# Patient Record
Sex: Male | Born: 1935 | Race: Black or African American | Hispanic: No | Marital: Married
Health system: Southern US, Community
[De-identification: ages and names within clinical notes are randomized; demographics above are authoritative.]

## PROBLEM LIST (undated history)

## (undated) DIAGNOSIS — I10 Essential (primary) hypertension: Secondary | ICD-10-CM

## (undated) DIAGNOSIS — E785 Hyperlipidemia, unspecified: Secondary | ICD-10-CM

---

## 1999-07-11 ENCOUNTER — Emergency Department (HOSPITAL_COMMUNITY): Admission: EM | Admit: 1999-07-11 | Discharge: 1999-07-11 | Payer: Self-pay | Admitting: Emergency Medicine

## 2002-01-20 ENCOUNTER — Encounter: Admission: RE | Admit: 2002-01-20 | Discharge: 2002-01-20 | Payer: Self-pay | Admitting: *Deleted

## 2002-02-03 ENCOUNTER — Encounter: Admission: RE | Admit: 2002-02-03 | Discharge: 2002-02-19 | Payer: Self-pay | Admitting: *Deleted

## 2002-03-19 ENCOUNTER — Encounter: Admission: RE | Admit: 2002-03-19 | Discharge: 2002-03-19 | Payer: Self-pay | Admitting: Neurosurgery

## 2002-03-19 ENCOUNTER — Encounter: Payer: Self-pay | Admitting: Neurosurgery

## 2002-04-05 ENCOUNTER — Encounter: Admission: RE | Admit: 2002-04-05 | Discharge: 2002-04-05 | Payer: Self-pay | Admitting: Neurosurgery

## 2002-04-05 ENCOUNTER — Encounter: Payer: Self-pay | Admitting: Neurosurgery

## 2002-04-19 ENCOUNTER — Encounter: Admission: RE | Admit: 2002-04-19 | Discharge: 2002-04-19 | Payer: Self-pay | Admitting: Neurosurgery

## 2002-04-19 ENCOUNTER — Encounter: Payer: Self-pay | Admitting: Neurosurgery

## 2003-01-26 ENCOUNTER — Encounter: Payer: Self-pay | Admitting: Internal Medicine

## 2003-01-26 ENCOUNTER — Encounter: Admission: RE | Admit: 2003-01-26 | Discharge: 2003-01-26 | Payer: Self-pay | Admitting: Internal Medicine

## 2003-04-25 ENCOUNTER — Encounter: Payer: Self-pay | Admitting: Internal Medicine

## 2003-04-25 ENCOUNTER — Encounter: Admission: RE | Admit: 2003-04-25 | Discharge: 2003-04-25 | Payer: Self-pay | Admitting: Internal Medicine

## 2003-05-27 ENCOUNTER — Encounter (INDEPENDENT_AMBULATORY_CARE_PROVIDER_SITE_OTHER): Payer: Self-pay | Admitting: Specialist

## 2003-05-27 ENCOUNTER — Ambulatory Visit (HOSPITAL_COMMUNITY): Admission: RE | Admit: 2003-05-27 | Discharge: 2003-05-27 | Payer: Self-pay | Admitting: *Deleted

## 2003-07-29 ENCOUNTER — Ambulatory Visit (HOSPITAL_COMMUNITY): Admission: RE | Admit: 2003-07-29 | Discharge: 2003-07-29 | Payer: Self-pay | Admitting: General Surgery

## 2004-03-07 ENCOUNTER — Ambulatory Visit (HOSPITAL_COMMUNITY): Admission: RE | Admit: 2004-03-07 | Discharge: 2004-03-07 | Payer: Self-pay | Admitting: Internal Medicine

## 2004-12-07 ENCOUNTER — Encounter: Admission: RE | Admit: 2004-12-07 | Discharge: 2004-12-07 | Payer: Self-pay | Admitting: Urology

## 2004-12-14 ENCOUNTER — Ambulatory Visit (HOSPITAL_COMMUNITY): Admission: RE | Admit: 2004-12-14 | Discharge: 2004-12-14 | Payer: Self-pay | Admitting: Urology

## 2004-12-14 ENCOUNTER — Encounter (INDEPENDENT_AMBULATORY_CARE_PROVIDER_SITE_OTHER): Payer: Self-pay | Admitting: Specialist

## 2004-12-14 ENCOUNTER — Ambulatory Visit (HOSPITAL_BASED_OUTPATIENT_CLINIC_OR_DEPARTMENT_OTHER): Admission: RE | Admit: 2004-12-14 | Discharge: 2004-12-14 | Payer: Self-pay | Admitting: Urology

## 2005-03-04 ENCOUNTER — Ambulatory Visit (HOSPITAL_BASED_OUTPATIENT_CLINIC_OR_DEPARTMENT_OTHER): Admission: RE | Admit: 2005-03-04 | Discharge: 2005-03-04 | Payer: Self-pay | Admitting: Urology

## 2005-03-04 ENCOUNTER — Ambulatory Visit (HOSPITAL_COMMUNITY): Admission: RE | Admit: 2005-03-04 | Discharge: 2005-03-04 | Payer: Self-pay | Admitting: Urology

## 2008-08-02 ENCOUNTER — Encounter: Admission: RE | Admit: 2008-08-02 | Discharge: 2008-08-02 | Payer: Self-pay | Admitting: Internal Medicine

## 2008-08-23 ENCOUNTER — Encounter: Admission: RE | Admit: 2008-08-23 | Discharge: 2008-08-23 | Payer: Self-pay | Admitting: Internal Medicine

## 2008-09-06 ENCOUNTER — Encounter: Admission: RE | Admit: 2008-09-06 | Discharge: 2008-09-06 | Payer: Self-pay | Admitting: Internal Medicine

## 2011-04-26 NOTE — Op Note (Signed)
NAME:  Travis Friedman, Travis Friedman NO.:  [id number]   MEDICAL RECORD NO.:  [id number]          PATIENT TYPE:  AMB   LOCATION:  [facility]                         FACILITY:  [facility]   PHYSICIAN:  Mark C. Vernie Ammons, M.D.  DATE OF BIRTH:  18-Jan-1926   DATE OF PROCEDURE:  12/14/2004  DATE OF DISCHARGE:                                 OPERATIVE REPORT   PREOPERATIVE DIAGNOSES:  1.  Peyronie's disease with penile curvature.  2.  Left epididymal cyst.   POSTOPERATIVE DIAGNOSES:  1.  Peyronie's disease with penile curvature.  2.  Left epididymal cyst.   PROCEDURES:  1.  Nesbit tuck.  2.  Excision of left epididymal cyst.   SURGEON:  Mark C. Vernie Ammons, M.D.   ANESTHESIA:  General anesthesia.   ESTIMATED BLOOD LOSS:  10 mL.   SPECIMENS:  Cyst wall to pathology.   DRAIN:  None.   COMPLICATIONS:  None.   INDICATIONS FOR PROCEDURE:  Patient is a 77 year old black male who has  erectile dysfunction managed with oral agents.  He has penile curvature and  has elected to undergo a Nesbit tuck after considering the treatment  options, risks and complications.  He mentioned also that he would like to  have the cyst in the left hemiscrotum treated at the same time.  It has  caused him pain.   DESCRIPTION OF PROCEDURE:  After informed consent, patient brought to the  major OR and placed on the table and administered general anesthesia, then  had his genitalia sterilely prepped and draped.  A midline median Raphe  incision was then made and the left testicle was exposed and delivered  through the incision.  Large cyst was identified and excised from the top of  the epididymis.  The edges of the incision were fulgurated.  No bleeding was  noted.  There were a couple of small cysts along the lateral aspect of the  epididymis with its junction in the testicle. These were approximately 2 mm  in size and there were three of these.  These were each fulgurated.  I then  replaced the testicle back  in the parietal tunica vaginalis and closed that  with a running locking 4-0 chromic suture.  The testicle was then replaced  in the left hemiscrotum and the skin was closed after observation for any  bleeding points, with running 3-0 chromic suture.   Attention was then directed to the penis where dorsal penile block was  performed using 17 mL of 0.25% plain Marcaine.  I then retracted the  foreskin and made a circumcising incision circumferentially around  approximately a centimeter from the coronal sulcus.  The penile shaft skin  was then dissected back to the base and a half inch Penrose drain was placed  around the base of the penis as a penile tourniquet.  A 21-gauge butterfly  needle was then placed in the right corpus cavernosum and an artificial  erection was performed using sterile injectable saline.  This resulting in a  good erection but significant dorsal curvature, approximately 50 to 60%.  I  noted the area  of greatest curvature and opposite this on the ventral  surface next to the corpus spongiosum.  Tissue was cleared from the  overlying corpora.  Allis clamps were placed bilaterally and repeat  artificial erection was performed.  There was straightening of the penis  noted with this maneuver.  I therefore used a 4-0 Ethibond suture, placed it  beneath the Allis clamps on each side in a figure-of-eight fashion and tied  these each.  Repeat artificial erection revealed still some curvature so I  repeated this procedure more proximally.  After completing this and  repeating the artificial erection, I noted significant improvement in his  curvature.  There was still some slight curvature persisting, however, my  feeling was that further Nesbit tucks would potentially foreshorten the  penis further and therefore elected not to perform that, leaving a slight  amount of curvature but yet a fully functional and nearly straight penis.  Small bleeding noted after removal of the  tourniquet were fulgurated.  I  then replaced the skin in its normal anatomic position and reapproximated  the skin edges with a running 3-0 chromic suture.  Neosporin was placed on  the suture line and the foreskin was placed back over the glans in its  normal anatomic position.  Fluff 4x4s were applied to the scrotal incision  as well as a sterile gauze dressing and scrotal support.  The patient  tolerated the procedure well.  There were no intraoperative complications.  Sponge, needle and instrument counts were correct at the end of the  operation.   He will begin a prescription of 40 Tylox and 10 Keflex to take at 500 mg  dose b.i.d.  He will follow up in my office in two weeks and contact me if  he has any difficulty in the interim.     Mark   MCO/MEDQ  D:  12/14/2004  T:  12/14/2004  Job:  [id number]

## 2011-04-26 NOTE — Op Note (Signed)
   NAME:  Travis Friedman, Travis Friedman NO.:  [id number]   MEDICAL RECORD NO.:  [id number]                   PATIENT TYPE:  AMB   LOCATION:  ENDO                                 FACILITY:  [facility]   PHYSICIAN:  Georgiana Spinner, M.D.                 DATE OF BIRTH:  09-14-1926   DATE OF PROCEDURE:  DATE OF DISCHARGE:                                 OPERATIVE REPORT   PROCEDURE:  Upper endoscopy.   INDICATIONS FOR PROCEDURE:  Dysphagia, weight loss.   ANESTHESIA:  Demerol 45, Versed 5 mg.   DESCRIPTION OF PROCEDURE:  With the patient mildly sedated in the left  lateral decubitus position, the Olympus videoscopic endoscope was inserted  in the mouth and passed under direct vision through the esophagus which  appeared normal into the stomach. The fundus, body, and antrum were normal  except for some changes of erythema in the prepyloric area of the antrum.  The duodenal bulb and second portion of the duodenum appeared normal. From  this point, the endoscope was slowly withdrawn taking circumferential views  of the duodenal mucosa until the endoscope was then pulled back in the  stomach, placed in retroflexion to view the stomach from below. The  endoscope was then straightened and withdrawn taking circumferential views  of the remaining gastric and esophageal mucosa. The patient's vital signs  and pulse oximeter remained stable. The patient tolerated the procedure well  without apparent complications.   FINDINGS:  Mild erythema of the prepyloric area biopsied. Await biopsy  report. The patient will call me for results and followup with me as an  outpatient. Proceed to colonoscopy.                                               Georgiana Spinner, M.D.    GMO/MEDQ  D:  05/27/2003  T:  05/28/2003  Job:  [id number]

## 2011-04-26 NOTE — Op Note (Signed)
NAME:  KRATOS, RUSCITTI                         ACCOUNT NO.:  [id number]   MEDICAL RECORD NO.:  [id number]                   PATIENT TYPE:  AMB   LOCATION:  DAY                                  FACILITY:  [facility]   PHYSICIAN:  Adolph Pollack, M.D.            DATE OF BIRTH:  [birth year]   DATE OF PROCEDURE:  07/29/2003  DATE OF DISCHARGE:                                 OPERATIVE REPORT   PREOPERATIVE DIAGNOSIS:  Left inguinal hernia.   POSTOPERATIVE DIAGNOSIS:  Indirect left inguinal hernia.   PROCEDURE:  Repair of indirect left inguinal hernia with mesh.   SURGEON:  Adolph Pollack, M.D.   ANESTHESIA:  General by way of LMA.   INDICATION:  Mr. Brandis is a 77 year old male, who has an enlarging left  inguinal bulge that is symptomatic.  On examination, he has a left inguinal  hernia.  The right inguinal floor was solid.  He now presents for elective  repair.  The procedure and the risks were discussed with him preoperatively.   TECHNIQUE:  He is seen in the holding area, and the left groin marked with  my initials.  He was then brought to the operating room, placed supine on  the operating table, and a general anesthetic by way of LMA was  administered.  The left groin area was shaved, marked again, then sterilely  prepped and draped.  Local anesthetic consisting of 0.5% plain Marcaine was  infiltrated in the subdermal and deep subcutaneous tissues.  An oblique  incision was made in the left groin and carried down through the Scarpa's  fascia to the level of the external oblique aponeurosis.  More local  anesthetic was infiltrated deep to this, and a small incision was made in  the external oblique aponeurosis.  It was then split in the direction of its  fibers through the external ring medially and up toward the anterior-  superior iliac spine laterally.  Using blunt dissection, the underlying  internal oblique muscle and aponeurosis were exposed superiorly, and the  shelving edge of the inguinal ligament was exposed inferiorly.  Pubic  tubercle was palpated, and the cord was isolated here.  There was an  indirect sac with what appeared to be preperitoneal fat in it.  There was  also a lipoma when the cord was excised.  I reduced the indirect sac back  through a patulous internal ring where it would stay.  I placed a Penrose  drain around the spermatic cord.   Next, a piece of 3 x 6 inch polypropylene mesh was brought into the field  and anchored 1 cm medial to the pubic tubercle with 2-0 Prolene suture.  The  inferior aspect of the mesh was then anchored to the shelving edge of the  inguinal ligament with a running 0 Prolene suture up to the level 1 cm  lateral to the internal ring.  A slit was  cut in the mesh and wrapped around  the cord.  The superior aspect of the mesh was then anchored to the internal  oblique muscle and aponeurosis with interrupted 2-0 Vicryl sutures.  The two  tails of the mesh were crossed, creating a new internal ring, and these were  then anchored to the shelving edge of the inguinal ligament with a single 2-  0 Prolene suture.  The tip of the hemostat could be placed in the new  aperture, and the cord was mobile.   At this point, hemostasis was found to be adequate.  I then closed the  external oblique aponeurosis over the mesh with a running 3-0 Vicryl suture.  Scarpa's fascia was closed with a running 3-0 Vicryl suture.  The skin was  closed with a 4-0 Monocryl subcuticular stitch.  Steri-Strips and sterile  dressings were applied.   He tolerated the procedure well without any apparent complications.  The  left testicle was in its normal position in the scrotum.  He was taken to  the recovery room in satisfactory condition.  I plan to discharge him to  home with discharge instructions and Tylox for pain and see him back in the  office in 2-3 weeks.                                               Adolph Pollack,  M.D.    Kari Baars  D:  07/29/2003  T:  07/29/2003  Job:  [id number]   cc:   Loraine Leriche C. Vernie Ammons, M.D.  [address]  [place]  [place] [address]  Fax: [phone]   Georgianne Fick, M.D.  [address]  [place]  [place] [address]  Fax: [phone]

## 2011-04-26 NOTE — Op Note (Signed)
   NAME:  Travis Friedman, Travis Friedman NO.:  [id number]   MEDICAL RECORD NO.:  [id number]                   PATIENT TYPE:  AMB   LOCATION:  ENDO                                 FACILITY[facility]  [facility]   PHYSICIAN:  Georgiana Spinner, M.D.                 DATE OF BIRTH:  06-03-1926   DATE OF PROCEDURE:  05/27/2003  DATE OF DISCHARGE:                                 OPERATIVE REPORT   PROCEDURE:  Colonoscopy.   INDICATIONS:  Weight loss, colon cancer screening.   ANESTHESIA:  1. Demerol 20 mg.  2. Versed 1.5 mg.   DESCRIPTION OF PROCEDURE:  With patient mildly sedated in the left lateral  decubitus position, the Olympus videoscopic colonoscope was inserted in the  rectum and passed under direct vision to the cecum, identified by the  ileocecal valve and appendiceal orifice.  From this point, the colonoscope  was slowly withdrawn, taking circumferential views of the entire colonic  mucosa, stopping only in the rectum which appeared normal on direct and  showed hemorrhoids on retroflexed view.  The endoscope was straightened and  withdrawn.  The patient's vital signs and pulse oximeter remained stable.  The patient tolerated the procedure well without apparent complications.   FINDINGS:  Internal hemorrhoids, otherwise unremarkable colonoscopic  examination.   PLAN:  See endoscopy note for further details.                                               Georgiana Spinner, M.D.    GMO/MEDQ  D:  05/27/2003  T:  05/28/2003  Job:  [id number]

## 2011-04-26 NOTE — Op Note (Signed)
NAME:  Travis Friedman, Travis Friedman NO.:  [id number]   MEDICAL RECORD NO.:  [id number]          PATIENT TYPE:  AMB   LOCATION:  [facility]                         FACILITY:  [facility]   PHYSICIAN:  Mark C. Vernie Ammons, M.D.  DATE OF BIRTH:  [birth year]   DATE OF PROCEDURE:  03/04/2005  DATE OF DISCHARGE:                                 OPERATIVE REPORT   PREOPERATIVE DIAGNOSIS:  Phimosis   POSTOPERATIVE DIAGNOSIS:  Phimosis.   PROCEDURE:  Circumcision.   SURGEON:  Mark C. Vernie Ammons, M.D.   ANESTHESIA:  General.   BLOOD LOSS:  15 mL.   SPECIMENS:  None.   DRAINS:  None.   COMPLICATIONS:  None.   INDICATIONS:  The patient is a 77 year old black male who had a Nesbit tuck  procedure for Peyronie's disease earlier this year.  He did well  postoperatively, but he was uncircumcised and developed an inability to  retract his foreskin.  I think what happened was the two halves of the inner  and outer preputial tissue used together.  We discussed the fact that  circumcision was the only form of treatment for that, and he understands the  risks and complications as well as increased risk of edema formation.  He  has elected to proceed with circumcision at this time.   DESCRIPTION OF OPERATION:  After informed consent, the patient was brought  to the major OR and placed on the table, administered general anesthesia,  after which his genitalia was sterilely prepped and draped.  A 16-French  Foley catheter was placed in the bladder and I initially attempted to  retract foreskin, but it was densely scarred as described above.  I first  tried to incise circumferentially at the junction of the inner and outer  preputial skin and dissect the shaft skin off the inner preputial skin but  due to the extensive scarring was unsuccessful.  The only way to remove the  skin and allow me to perform a circumcision was to make a midline incision  in the skin back to close to the corona and back down the  shaft of the  penis.  This allowed me to then dissect the skin off of the penis  circumferentially.  I then excised the redundant inner preputial skin,  making an incision approximately cm proximal to the corona  circumferentially.  The inner preputial skin was then excised.  There was a  great deal of scarring. This was very difficult and extended the time need  to greater than double the usual time it takes to do this procedure.  Bleeding points were cauterized with electrocautery.  I then reapproximated  the midline penile skin incision with running 3-0 chromic suture until I got  to a point where there was enough shaft skin left for appropriate closure.  I excised the remaining shaft skin circumferentially and placed 3-0 chromic  sutures in the frenulum in an fashion.  A U stitch was then placed at the  level of frenulum and I then reapproximated the two skin edges with running  3-0 chromic suture.  A dorsal penile  block was then performed using 0.5%  plain Marcaine in the standard fashion and Neosporin as well as a loose  dressing was applied.  The patient was awakened and taken to the recovery  room in stable, satisfactory condition.  He tolerated procedure well with no  intraoperative complications.   He was given a prescription for 36 Tylox and Keflex 500 mg t.i.d. for five  days.  He will follow up my office in four weeks for recheck.      MCO/MEDQ  D:  03/04/2005  T:  03/04/2005  Job:  [id number]

## 2015-03-23 ENCOUNTER — Other Ambulatory Visit: Payer: Self-pay | Admitting: Internal Medicine

## 2015-03-23 ENCOUNTER — Ambulatory Visit
Admission: RE | Admit: 2015-03-23 | Discharge: 2015-03-23 | Disposition: A | Discharge status: Home / Self Care | Payer: Self-pay | Source: Ambulatory Visit | Attending: Internal Medicine | Admitting: Internal Medicine

## 2015-03-23 DIAGNOSIS — R1084 Generalized abdominal pain: Secondary | ICD-10-CM

## 2015-03-23 DIAGNOSIS — R52 Pain, unspecified: Secondary | ICD-10-CM

## 2015-03-23 DIAGNOSIS — R634 Abnormal weight loss: Secondary | ICD-10-CM

## 2015-03-23 MED ORDER — IOPAMIDOL (ISOVUE-300) INJECTION 61%
100.0000 mL | Freq: Once | INTRAVENOUS | Status: AC | PRN
  Administered 2015-03-23: 100 mL via INTRAVENOUS

## 2015-04-15 ENCOUNTER — Inpatient Hospital Stay (HOSPITAL_COMMUNITY): Payer: Medicare Other

## 2015-04-15 ENCOUNTER — Encounter (HOSPITAL_COMMUNITY): Payer: Self-pay | Admitting: Emergency Medicine

## 2015-04-15 ENCOUNTER — Inpatient Hospital Stay (HOSPITAL_COMMUNITY)
Admission: EM | Admit: 2015-04-15 | Discharge: 2015-05-10 | DRG: 377 | Disposition: E | Payer: Medicare Other | Attending: Pulmonary Disease | Admitting: Pulmonary Disease

## 2015-04-15 DIAGNOSIS — G9340 Encephalopathy, unspecified: Secondary | ICD-10-CM

## 2015-04-15 DIAGNOSIS — E1165 Type 2 diabetes mellitus with hyperglycemia: Secondary | ICD-10-CM | POA: Diagnosis present

## 2015-04-15 DIAGNOSIS — E876 Hypokalemia: Secondary | ICD-10-CM | POA: Diagnosis present

## 2015-04-15 DIAGNOSIS — Z66 Do not resuscitate: Secondary | ICD-10-CM | POA: Diagnosis not present

## 2015-04-15 DIAGNOSIS — T39395A Adverse effect of other nonsteroidal anti-inflammatory drugs [NSAID], initial encounter: Secondary | ICD-10-CM | POA: Diagnosis not present

## 2015-04-15 DIAGNOSIS — Z791 Long term (current) use of non-steroidal anti-inflammatories (NSAID): Secondary | ICD-10-CM | POA: Diagnosis not present

## 2015-04-15 DIAGNOSIS — D72829 Elevated white blood cell count, unspecified: Secondary | ICD-10-CM | POA: Diagnosis present

## 2015-04-15 DIAGNOSIS — R579 Shock, unspecified: Secondary | ICD-10-CM | POA: Diagnosis present

## 2015-04-15 DIAGNOSIS — D696 Thrombocytopenia, unspecified: Secondary | ICD-10-CM | POA: Diagnosis present

## 2015-04-15 DIAGNOSIS — K264 Chronic or unspecified duodenal ulcer with hemorrhage: Principal | ICD-10-CM | POA: Diagnosis present

## 2015-04-15 DIAGNOSIS — G9341 Metabolic encephalopathy: Secondary | ICD-10-CM | POA: Diagnosis present

## 2015-04-15 DIAGNOSIS — R58 Hemorrhage, not elsewhere classified: Secondary | ICD-10-CM

## 2015-04-15 DIAGNOSIS — E872 Acidosis, unspecified: Secondary | ICD-10-CM

## 2015-04-15 DIAGNOSIS — T68XXXA Hypothermia, initial encounter: Secondary | ICD-10-CM

## 2015-04-15 DIAGNOSIS — D62 Acute posthemorrhagic anemia: Secondary | ICD-10-CM | POA: Diagnosis present

## 2015-04-15 DIAGNOSIS — I10 Essential (primary) hypertension: Secondary | ICD-10-CM | POA: Diagnosis present

## 2015-04-15 DIAGNOSIS — N179 Acute kidney failure, unspecified: Secondary | ICD-10-CM | POA: Diagnosis present

## 2015-04-15 DIAGNOSIS — Z79899 Other long term (current) drug therapy: Secondary | ICD-10-CM | POA: Diagnosis not present

## 2015-04-15 DIAGNOSIS — I9589 Other hypotension: Secondary | ICD-10-CM

## 2015-04-15 DIAGNOSIS — E785 Hyperlipidemia, unspecified: Secondary | ICD-10-CM | POA: Diagnosis present

## 2015-04-15 DIAGNOSIS — F0391 Unspecified dementia with behavioral disturbance: Secondary | ICD-10-CM | POA: Diagnosis present

## 2015-04-15 DIAGNOSIS — K922 Gastrointestinal hemorrhage, unspecified: Secondary | ICD-10-CM | POA: Diagnosis not present

## 2015-04-15 HISTORY — DX: Essential (primary) hypertension: I10

## 2015-04-15 HISTORY — DX: Hyperlipidemia, unspecified: E78.5

## 2015-04-15 LAB — I-STAT CG4 LACTIC ACID, ED: Lactic Acid, Venous: 8.59 mmol/L (ref 0.5–2.0)

## 2015-04-15 LAB — COMPREHENSIVE METABOLIC PANEL
ALT: 10 U/L — AB (ref 17–63)
ANION GAP: 10 (ref 5–15)
AST: 20 U/L (ref 15–41)
Albumin: 2 g/dL — ABNORMAL LOW (ref 3.5–5.0)
Alkaline Phosphatase: 47 U/L (ref 38–126)
BUN: 64 mg/dL — AB (ref 6–20)
CALCIUM: 7.4 mg/dL — AB (ref 8.9–10.3)
CO2: 16 mmol/L — AB (ref 22–32)
Chloride: 109 mmol/L (ref 101–111)
Creatinine, Ser: 2.2 mg/dL — ABNORMAL HIGH (ref 0.61–1.24)
GFR calc Af Amer: 29 mL/min — ABNORMAL LOW (ref >60)
GFR, EST NON AFRICAN AMERICAN: 25 mL/min — AB (ref >60)
Glucose, Bld: 232 mg/dL — ABNORMAL HIGH (ref 70–99)
Potassium: 2.4 mmol/L — CL (ref 3.5–5.1)
SODIUM: 135 mmol/L (ref 135–145)
Total Bilirubin: 0.6 mg/dL (ref 0.3–1.2)
Total Protein: 4.3 g/dL — ABNORMAL LOW (ref 6.5–8.1)

## 2015-04-15 LAB — I-STAT CHEM 8, ED
BUN: 69 mg/dL — ABNORMAL HIGH (ref 6–20)
BUN: 57 mg/dL — ABNORMAL HIGH (ref 6–20)
CALCIUM ION: 1.1 mmol/L — AB (ref 1.13–1.30)
CREATININE: 1.8 mg/dL — AB (ref 0.61–1.24)
Calcium, Ion: 1 mmol/L — ABNORMAL LOW (ref 1.13–1.30)
Chloride: 105 mmol/L (ref 101–111)
Chloride: 106 mmol/L (ref 101–111)
Creatinine, Ser: 2.1 mg/dL — ABNORMAL HIGH (ref 0.61–1.24)
GLUCOSE: 225 mg/dL — AB (ref 70–99)
GLUCOSE: 166 mg/dL — AB (ref 70–99)
HCT: 36 % — ABNORMAL LOW (ref 39.0–52.0)
HEMATOCRIT: 13 % — AB (ref 39.0–52.0)
HEMOGLOBIN: 4.4 g/dL — AB (ref 13.0–17.0)
HEMOGLOBIN: 12.2 g/dL — AB (ref 13.0–17.0)
POTASSIUM: 3.1 mmol/L — AB (ref 3.5–5.1)
Potassium: 2.7 mmol/L — CL (ref 3.5–5.1)
Sodium: 142 mmol/L (ref 135–145)
Sodium: 144 mmol/L (ref 135–145)
TCO2: 14 mmol/L (ref 0–100)
TCO2: 15 mmol/L (ref 0–100)

## 2015-04-15 LAB — CBC WITH DIFFERENTIAL/PLATELET
BASOS ABS: 0 10*3/uL (ref 0.0–0.1)
Basophils Relative: 0 % (ref 0–1)
EOS PCT: 0 % (ref 0–5)
Eosinophils Absolute: 0 10*3/uL (ref 0.0–0.7)
HCT: 17 % — ABNORMAL LOW (ref 39.0–52.0)
Hemoglobin: 5.7 g/dL — CL (ref 13.0–17.0)
LYMPHS PCT: 8 % — AB (ref 12–46)
Lymphs Abs: 1 10*3/uL (ref 0.7–4.0)
MCH: 30.2 pg (ref 26.0–34.0)
MCHC: 33.5 g/dL (ref 30.0–36.0)
MCV: 89.9 fL (ref 78.0–100.0)
Monocytes Absolute: 0.9 10*3/uL (ref 0.1–1.0)
Monocytes Relative: 7 % (ref 3–12)
NEUTROS ABS: 10.5 10*3/uL — AB (ref 1.7–7.7)
NEUTROS PCT: 85 % — AB (ref 43–77)
PLATELETS: 147 10*3/uL — AB (ref 150–400)
RBC: 1.89 MIL/uL — ABNORMAL LOW (ref 4.22–5.81)
RDW: 13.1 % (ref 11.5–15.5)
WBC: 12.4 10*3/uL — ABNORMAL HIGH (ref 4.0–10.5)

## 2015-04-15 LAB — PROTIME-INR
INR: 1.34 (ref 0.00–1.49)
Prothrombin Time: 16.7 seconds — ABNORMAL HIGH (ref 11.6–15.2)

## 2015-04-15 LAB — I-STAT TROPONIN, ED: Troponin i, poc: 0.01 ng/mL (ref 0.00–0.08)

## 2015-04-15 LAB — PREPARE RBC (CROSSMATCH)

## 2015-04-15 LAB — CBC
HCT: 35 % — ABNORMAL LOW (ref 39.0–52.0)
Hemoglobin: 12 g/dL — ABNORMAL LOW (ref 13.0–17.0)
MCH: 30.6 pg (ref 26.0–34.0)
MCHC: 34.3 g/dL (ref 30.0–36.0)
MCV: 89.3 fL (ref 78.0–100.0)
Platelets: 111 10*3/uL — ABNORMAL LOW (ref 150–400)
RBC: 3.92 MIL/uL — ABNORMAL LOW (ref 4.22–5.81)
RDW: 13 % (ref 11.5–15.5)
WBC: 20 10*3/uL — AB (ref 4.0–10.5)

## 2015-04-15 LAB — ABO/RH: ABO/RH(D): O POS

## 2015-04-15 MED ORDER — HALOPERIDOL LACTATE 5 MG/ML IJ SOLN
5.0000 mg | Freq: Once | INTRAMUSCULAR | Status: AC
  Administered 2015-04-15: 5 mg via INTRAVENOUS
  Filled 2015-04-15: qty 1

## 2015-04-15 MED ORDER — PANTOPRAZOLE SODIUM 40 MG IV SOLR
8.0000 mg/h | INTRAVENOUS | Status: DC
  Administered 2015-04-15 – 2015-04-16 (×2): 8 mg/h via INTRAVENOUS
  Filled 2015-04-15 (×4): qty 80

## 2015-04-15 MED ORDER — SODIUM CHLORIDE 0.9 % IV BOLUS (SEPSIS)
1000.0000 mL | Freq: Once | INTRAVENOUS | Status: AC
Start: 2015-04-15 — End: 2015-04-15
  Administered 2015-04-15: 1000 mL via INTRAVENOUS

## 2015-04-15 MED ORDER — HALOPERIDOL LACTATE 5 MG/ML IJ SOLN
5.0000 mg | Freq: Four times a day (QID) | INTRAMUSCULAR | Status: DC | PRN
Start: 2015-04-15 — End: 2015-04-16
  Administered 2015-04-16: 5 mg via INTRAVENOUS
  Filled 2015-04-15: qty 1

## 2015-04-15 MED ORDER — ONDANSETRON HCL 4 MG/2ML IJ SOLN
4.0000 mg | Freq: Once | INTRAMUSCULAR | Status: AC
  Administered 2015-04-15: 4 mg via INTRAVENOUS
  Filled 2015-04-15: qty 2

## 2015-04-15 MED ORDER — POTASSIUM CHLORIDE 10 MEQ/100ML IV SOLN
10.0000 meq | Freq: Once | INTRAVENOUS | Status: AC
  Administered 2015-04-15: 10 meq via INTRAVENOUS
  Filled 2015-04-15: qty 100

## 2015-04-15 MED ORDER — CEFTRIAXONE SODIUM 1 G IJ SOLR
1.0000 g | Freq: Two times a day (BID) | INTRAMUSCULAR | Status: DC
  Administered 2015-04-15 – 2015-04-16 (×2): 1 g via INTRAVENOUS
  Filled 2015-04-15 (×3): qty 10

## 2015-04-15 MED ORDER — PANTOPRAZOLE SODIUM 40 MG IV SOLR
80.0000 mg | Freq: Once | INTRAVENOUS | Status: AC
  Administered 2015-04-15: 80 mg via INTRAVENOUS
  Filled 2015-04-15: qty 80

## 2015-04-15 MED ORDER — SODIUM CHLORIDE 0.9 % IV SOLN: 250.0000 mL | INTRAVENOUS | Status: DC | PRN

## 2015-04-15 NOTE — ED Notes (Signed)
Per EMS report- called by family for GI bleeding from rectum and nose; EMS reports large amount of dark stools; EMS reports palpable carotid pulses but not radial pulses and agonal respirations that were corrected with BVM by other first responders; IVs established and fluids initiated and patients LOC improved and VS improved; EMS estimates at least 1L blood loss on scene; family on scene reports decrease in ADLs and PO intake x "couple days"

## 2015-04-15 NOTE — ED Notes (Signed)
Family now at bedside- states pt has "had a steady decline over the last few months, not eating, not getting around"; family also states pt had a fall last night in the tub but denies LOC

## 2015-04-15 NOTE — ED Notes (Signed)
Dr Jodi MourningZavitz given a copy of chem 8 and lactic acid results

## 2015-04-15 NOTE — ED Notes (Signed)
Pt verbalizing pain in the lower abd and "tightness"; MD Artis Flock(Wol[place]) notified

## 2015-04-15 NOTE — ED Provider Notes (Signed)
CSN: [id number]     Arrival date & time 11-May-2015  1918 History   First MD Initiated Contact with Patient May 11, 2015 1928     Chief Complaint  Patient presents with  . GI Bleeding  . Altered Mental Status     (Consider location/radiation/quality/duration/timing/severity/associated sxs/prior Treatment) Patient is a 81 y.o. male presenting with hematochezia. The history is provided by the EMS personnel. The history is limited by the condition of the patient.  Rectal Bleeding Quality:  Black and tarry Amount:  Copious Duration:  1 day Timing:  Constant Progression:  Unchanged Chronicity:  New Context: spontaneously   Similar prior episodes: no   Relieved by:  Nothing Worsened by:  Nothing tried Ineffective treatments:  None tried Associated symptoms: abdominal pain   Associated symptoms: no fever, no loss of consciousness and no vomiting   Associated symptoms comment:  Lethargic, altered mental status Abdominal pain:    Location:  Generalized   Quality:  Unable to specify   Severity:  Moderate   Onset quality:  Unable to specify   Duration:  1 day   Timing:  Constant   Progression:  Unchanged Risk factors: NSAID use   Risk factors: no anticoagulant use     Past Medical History  Diagnosis Date  . Hypertension   . Hyperlipidemia    Past Surgical History  Procedure Laterality Date  . Esophagogastroduodenoscopy N/A 04/28/2015    Procedure: ESOPHAGOGASTRODUODENOSCOPY (EGD);  Surgeon: Rachael Fee, MD;  Location: [facility] ENDOSCOPY;  Service: Endoscopy;  Laterality: N/A;  bedside   History reviewed. No pertinent family history. History  Substance Use Topics  . Smoking status: Unknown If Ever Smoked  . Smokeless tobacco: Not on file  . Alcohol Use: No     Comment: unknown    Review of Systems  Unable to perform ROS: Mental status change  Constitutional: Negative for fever.  Gastrointestinal: Positive for abdominal pain and hematochezia. Negative for vomiting.  Neurological:  Negative for loss of consciousness.      Allergies  Review of patient's allergies indicates no known allergies.  Home Medications   Prior to Admission medications   Medication Sig Start Date End Date Taking? Authorizing Provider  amLODipine (NORVASC) 10 MG tablet Take 10 mg by mouth daily.  04/10/15  Yes Historical Provider, MD  atorvastatin (LIPITOR) 20 MG tablet Take 20 mg by mouth daily. 02/13/15  Yes Historical Provider, MD  diclofenac (VOLTAREN) 50 MG EC tablet Take 50 mg by mouth 2 (two) times daily.  02/22/15  Yes Historical Provider, MD  donepezil (ARICEPT) 10 MG tablet Take 10 mg by mouth at bedtime.  03/27/15  Yes Historical Provider, MD  hydrochlorothiazide (HYDRODIURIL) 25 MG tablet Take 25 mg by mouth daily. 02/13/15  Yes Historical Provider, MD  irbesartan (AVAPRO) 300 MG tablet Take 300 mg by mouth daily.  03/30/15  Yes Historical Provider, MD  lubiprostone (AMITIZA) 24 MCG capsule Take 24 mcg by mouth 2 (two) times daily with a meal.   Yes Historical Provider, MD  PARoxetine (PAXIL) 20 MG tablet Take 20 mg by mouth daily.  03/31/15  Yes Historical Provider, MD  Psyllium (METAMUCIL PO) Take by mouth daily. Mix in liquid and drink   Yes Historical Provider, MD  tamsulosin (FLOMAX) 0.4 MG CAPS capsule Take 0.4 mg by mouth daily after breakfast.  04/10/15  Yes Historical Provider, MD  traMADol (ULTRAM) 50 MG tablet Take 50-100 mg by mouth every 8 (eight) hours as needed (pain).  03/27/15  Yes Historical  Provider, MD   BP 61/29 mmHg  Pulse 89  Temp(Src) 98.6 F (37 C) (Oral)  Resp 0  Ht  (1.676 m)  Wt 146 lb 6.2 oz (66.4 kg)  BMI 23.64 kg/m2  SpO2 100% Physical Exam  Constitutional: He appears well-developed and well-nourished. He appears lethargic. He appears distressed.  HENT:  Head: Normocephalic and atraumatic.  Eyes: Conjunctivae and EOM are normal. Pupils are equal, round, and reactive to light.  Neck: Normal range of motion. Neck supple.  Cardiovascular: Normal rate,  regular rhythm, normal heart sounds and intact distal pulses.  Exam reveals no gallop and no friction rub.   No murmur heard. Pulmonary/Chest: Effort normal and breath sounds normal. No respiratory distress. He has no wheezes. He has no rales.  Abdominal: Soft. Normal appearance and bowel sounds are normal. He exhibits no distension. There is generalized tenderness. There is no rigidity, no rebound and no guarding.  Genitourinary: Guaiac positive stool.  Moderate amount of melenotic stool from rectum but no current active bleeding  Musculoskeletal: Normal range of motion. He exhibits no edema.  Neurological: He has normal strength. He appears lethargic. He is disoriented. No cranial nerve deficit or sensory deficit. GCS eye subscore is 4. GCS verbal subscore is 4. GCS motor subscore is 6.  Skin: Skin is warm and dry. No rash noted. He is not diaphoretic. No erythema. No pallor.  Nursing note and vitals reviewed.   ED Course  CENTRAL LINE Date/Time: 04/09/2015 7:30 PM Performed by: Jodean Lima Authorized by: Blane Ohara Consent: The procedure was performed in an emergent situation. Patient identity confirmed: arm band Time out: Immediately prior to procedure a "time out" was called to verify the correct patient, procedure, equipment, support staff and site/side marked as required. Indications: vascular access Anesthesia: local infiltration Local anesthetic: lidocaine 1% with epinephrine Anesthetic total: 10 ml Patient sedated: no Preparation: skin prepped with 2% chlorhexidine Skin prep agent dried: skin prep agent completely dried prior to procedure Sterile barriers: all five maximum sterile barriers used - cap, mask, sterile gown, sterile gloves, and large sterile sheet Hand hygiene: hand hygiene performed prior to central venous catheter insertion Location details: right femoral Patient position: flat Catheter type: Cordis Pre-procedure: landmarks identified Ultrasound guidance:  yes Sterile ultrasound techniques: sterile gel and sterile probe covers were used Number of attempts: 1 Successful placement: yes Post-procedure: line sutured and dressing applied Assessment: blood return through all ports and free fluid flow Patient tolerance: Patient tolerated the procedure well with no immediate complications   (including critical care time) Labs Review Labs Reviewed  CBC WITH DIFFERENTIAL/PLATELET - Abnormal; Notable for the following:    WBC 12.4 (*)    RBC 1.89 (*)    Hemoglobin 5.7 (*)    HCT 17.0 (*)    Platelets 147 (*)    Neutrophils Relative % 85 (*)    Neutro Abs 10.5 (*)    Lymphocytes Relative 8 (*)    All other components within normal limits  COMPREHENSIVE METABOLIC PANEL - Abnormal; Notable for the following:    Potassium 2.4 (*)    CO2 16 (*)    Glucose, Bld 232 (*)    BUN 64 (*)    Creatinine, Ser 2.20 (*)    Calcium 7.4 (*)    Total Protein 4.3 (*)    Albumin 2.0 (*)    ALT 10 (*)    GFR calc non Af Amer 25 (*)    GFR calc Af Amer 29 (*)  All other components within normal limits  URINALYSIS, ROUTINE W REFLEX MICROSCOPIC - Abnormal; Notable for the following:    Hgb urine dipstick SMALL (*)    All other components within normal limits  PROTIME-INR - Abnormal; Notable for the following:    Prothrombin Time 16.7 (*)    All other components within normal limits  CBC - Abnormal; Notable for the following:    WBC 20.0 (*)    RBC 3.92 (*)    Hemoglobin 12.0 (*)    HCT 35.0 (*)    Platelets 111 (*)    All other components within normal limits  LACTIC ACID, PLASMA - Abnormal; Notable for the following:    Lactic Acid, Venous 4.5 (*)    All other components within normal limits  CBC - Abnormal; Notable for the following:    WBC 23.1 (*)    RBC 3.96 (*)    Hemoglobin 12.1 (*)    HCT 33.5 (*)    MCHC 36.1 (*)    Platelets 118 (*)    All other components within normal limits  BASIC METABOLIC PANEL - Abnormal; Notable for the  following:    Potassium 3.2 (*)    CO2 21 (*)    Glucose, Bld 102 (*)    BUN 58 (*)    Creatinine, Ser 1.56 (*)    Calcium 8.2 (*)    GFR calc non Af Amer 38 (*)    GFR calc Af Amer 44 (*)    All other components within normal limits  MAGNESIUM - Abnormal; Notable for the following:    Magnesium 2.5 (*)    All other components within normal limits  PHOSPHORUS - Abnormal; Notable for the following:    Phosphorus 1.6 (*)    All other components within normal limits  HEMOGLOBIN AND HEMATOCRIT, BLOOD - Abnormal; Notable for the following:    Hemoglobin 11.7 (*)    HCT 33.0 (*)    All other components within normal limits  BASIC METABOLIC PANEL - Abnormal; Notable for the following:    Potassium 2.9 (*)    BUN 60 (*)    Creatinine, Ser 1.76 (*)    Calcium 7.9 (*)    GFR calc non Af Amer 33 (*)    GFR calc Af Amer 38 (*)    All other components within normal limits  URINE MICROSCOPIC-ADD ON - Abnormal; Notable for the following:    Casts HYALINE CASTS (*)    All other components within normal limits  I-STAT CHEM 8, ED - Abnormal; Notable for the following:    Potassium 2.7 (*)    BUN 69 (*)    Creatinine, Ser 2.10 (*)    Glucose, Bld 225 (*)    Calcium, Ion 1.10 (*)    Hemoglobin 4.4 (*)    HCT 13.0 (*)    All other components within normal limits  I-STAT CG4 LACTIC ACID, ED - Abnormal; Notable for the following:    Lactic Acid, Venous 8.59 (*)    All other components within normal limits  I-STAT CHEM 8, ED - Abnormal; Notable for the following:    Potassium 3.1 (*)    BUN 57 (*)    Creatinine, Ser 1.80 (*)    Glucose, Bld 166 (*)    Calcium, Ion 1.00 (*)    Hemoglobin 12.2 (*)    HCT 36.0 (*)    All other components within normal limits  MRSA PCR SCREENING  MAGNESIUM  GLUCOSE, CAPILLARY  GLUCOSE,  CAPILLARY  GLUCOSE, CAPILLARY  I-STAT TROPOININ, ED  PREPARE RBC (CROSSMATCH)  TYPE AND SCREEN  PREPARE FRESH FROZEN PLASMA  PREPARE RBC (CROSSMATCH)  ABO/RH   PREPARE FRESH FROZEN PLASMA    Imaging Review No results found.   EKG Interpretation None      MDM   Final diagnoses:  Acute GI bleeding  Hypotension due to blood loss  Acute renal failure, unspecified acute renal failure type  Acute encephalopathy  Lactic acidosis  Hypokalemia  Hypothermia, initial encounter    81 yo M with PMH of HTN, dementia, presenting via EMS with acute GI bleed.  Onset of large volume melenotic stools today.  EMS reports estimated 1L melena produced during their resuscitation. No reported hematemesis. Pt altered, lethargic, and EMS unable to obtain BP although has palpable carotid pulses.  IVF bolused and pt had improvement in mental status en route.  Per family pt has had continual decline in mental status and dementia over past few months.  They state he takes no anti-coagulants, but on medication review he does take Diclofenac and they state he likely is taking his medications inappropriately.  On presentation, pt confused, altered but protecting airway and moving all extremities spontaneously.  Initial BP via manual cuff 57/43.  Active melena on exam.  Diffuse abdominal tenderness without peritoneal signs.  Emergent PRBC infusion initiated while Cordis placed in R femoral for resuscitation.  Initial i-stat Hgb 4.4 with lactate 8. Pt tranfused 4 units PRBC and 1 unit FFP with subsequent improvement in BP to 102/69 and improvement in mental status- now at baseline per family.  Discussed with GI who will follow and scope once pt stabilized.  BP continuing to improve, repeat Hgb 12.  Pt complaining of abdominal pain- CT A/P obtained, non-con due to AKI.  Pt admitted to ICU for further management with CT results pending.  No other acute events during my care.  Discussed with attending Dr. Jodi MourningZavitz.  Jodean LimaEmily Sundy Houchins, MD 04/19/15 [id number]  Blane OharaJoshua Zavitz, MD 04/21/15 [phone]

## 2015-04-15 NOTE — H&P (Signed)
PULMONARY / CRITICAL CARE MEDICINE   Name: Travis Friedman MRN: [id number] DOB: 10/20/1926    ADMISSION DATE:  04/23/2015 CONSULTATION DATE:  05/07/2015  REFERRING MD :  Dr. Jodi MourningZavitz, ED  CHIEF COMPLAINT:  GI bleed  INITIAL PRESENTATION:  Hypotensive and unresponsive.   STUDIES: none   SIGNIFICANT EVENTS: Pt has received 4L of PRBC and 1U FFP. Cordis was placed by the ED resident.   HISTORY OF PRESENT ILLNESS:  81yo AAM brought into ED via EMS after receiving a call from family stating that the patient was having a large amount of dark stool. Report obtained from granddaughter at bedside. Pt was not speaking and lethargic this morning. Daughter was called who arrived with granddaughter. They noted that the patient was minimally responsive. They called EMS and noted that he had dark black/red tarry stool. This started in the AM. They state that he was at his clinical baseline prior to this. He had been deteriorating significantly over the last couple weeks with decreased PO intake.   PAST MEDICAL HISTORY :   has a past medical history of Hypertension and Hyperlipidemia.  has no past surgical history on file. Prior to Admission medications   Medication Sig Start Date End Date Taking? Authorizing Provider  amLODipine (NORVASC) 10 MG tablet Take 10 mg by mouth daily.  04/10/15  Yes Historical Provider, MD  atorvastatin (LIPITOR) 20 MG tablet Take 20 mg by mouth daily. 02/13/15  Yes Historical Provider, MD  diclofenac (VOLTAREN) 50 MG EC tablet Take 50 mg by mouth 2 (two) times daily.  02/22/15  Yes Historical Provider, MD  donepezil (ARICEPT) 10 MG tablet Take 10 mg by mouth at bedtime.  03/27/15  Yes Historical Provider, MD  hydrochlorothiazide (HYDRODIURIL) 25 MG tablet Take 25 mg by mouth daily. 02/13/15  Yes Historical Provider, MD  irbesartan (AVAPRO) 300 MG tablet Take 300 mg by mouth daily.  03/30/15  Yes Historical Provider, MD  lubiprostone (AMITIZA) 24 MCG capsule Take 24 mcg by mouth 2 (two)  times daily with a meal.   Yes Historical Provider, MD  PARoxetine (PAXIL) 20 MG tablet Take 20 mg by mouth daily.  03/31/15  Yes Historical Provider, MD  Psyllium (METAMUCIL PO) Take by mouth daily. Mix in liquid and drink   Yes Historical Provider, MD  tamsulosin (FLOMAX) 0.4 MG CAPS capsule Take 0.4 mg by mouth daily after breakfast.  04/10/15  Yes Historical Provider, MD  traMADol (ULTRAM) 50 MG tablet Take 50-100 mg by mouth every 8 (eight) hours as needed (pain).  03/27/15  Yes Historical Provider, MD   No Known Allergies  FAMILY HISTORY: UTO has no family status information on file.  SOCIAL HISTORY: UTO    REVIEW OF SYSTEMS:  Patient with underlying dementia giving an unreliable account. He does not know where he is but states he wants to go home to take care of his wife. Pt only states that he hasn't been eating very much as of late.   SUBJECTIVE: pt currently states that he feels fine with no complaints. He states that he wishes to go home.   VITAL SIGNS: Temp:  [93.8 F (34.3 C)] 93.8 F (34.3 C) (05/07 2008) Pulse Rate:  [77-86] 81 (05/07 2135) Resp:  [13-20] 17 (05/07 2135) BP: (57-136)/(35-69) 126/60 mmHg (05/07 2146) SpO2:  [93 %-100 %] 100 % (05/07 2135) HEMODYNAMICS:   VENTILATOR SETTINGS:   INTAKE / OUTPUT:  Intake/Output Summary (Last 24 hours) at 05/08/2015 2153 Last data filed at 04/24/2015 2146  Gross per 24 hour  Intake    500 ml  Output      0 ml  Net    500 ml    PHYSICAL EXAMINATION: General:  Pt looks his stated age, thin, frail appearing. Neuro:  Alert awake, not oriented to time, he is aware of who the president is. Not oriented to year. MAE, no focal deficits.  HEENT:  [place]/AT, PERRL, EOMI, nose permeable, midline septum, dried blood around nares, dry mucosas, pt with dentures in plac Cardiovascular:  s1s2 RRR no MRG Lungs:  CTA bilaterally, no w/r/r Abdomen:  Soft to palpation, non-tender, non-distended, + bowel sounds Musculoskeletal:  MAE, strength  appear to be intact Skin:  Dry appearing, no lesions nor sores noted.   LABS:  CBC  Recent Labs Lab 04/18/2015 1959 04/27/2015 2000  WBC  --  12.4*  HGB 4.4* 5.7*  HCT 13.0* 17.0*  PLT  --  147*   Coag's  Recent Labs Lab 04/19/2015 2000  INR 1.34   BMET  Recent Labs Lab 04/23/2015 1959 04/27/2015 2000  NA 142 135  K 2.7* 2.4*  CL 105 109  CO2  --  16*  BUN 69* 64*  CREATININE 2.10* 2.20*  GLUCOSE 225* 232*   Electrolytes  Recent Labs Lab 04/14/2015 2000  CALCIUM 7.4*   Sepsis Markers  Recent Labs Lab 05/05/2015 2000  LATICACIDVEN 8.59*   ABG No results for input(s): PHART, PCO2ART, PO2ART in the last 168 hours. Liver Enzymes  Recent Labs Lab 04/18/2015 2000  AST 20  ALT 10*  ALKPHOS 47  BILITOT 0.6  ALBUMIN 2.0*   Cardiac Enzymes No results for input(s): TROPONINI, PROBNP in the last 168 hours. Glucose No results for input(s): GLUCAP in the last 168 hours.  Imaging No results found.   ASSESSMENT / PLAN:  PULMONARY A: no active issues  P:  Support as needed  CARDIOVASCULAR A: Currently normotensive s/p IVF and blood products. with a h/o hypertension P: hold home meds.  RENAL A:  AKI on likely CKD, unknown stage and unknown baseline, hypokalemia. Non-anion gap metabolic acidosis  P:  Provide IVF and IV potassium, trend K. Will check a BMP at midnight, add-on magnesium   GASTROINTESTINAL A:  GI bleed, likely upper 2/2 PUD. Pt takes NSAIDs at home. Dark tarry stool.  P:  GI consult, PPI drip, serial Hb   HEMATOLOGIC A:  Acute blood loss anemia 2/2 GI Bleed, thrombocytopenia, mild, likely 2/2 consumption P:  Will trend hb q6h  INFECTIOUS A:  Leukocytosis, likely reactive. Lactic acidosis . Do not suspect infectious etiology at this time  P:  S/p rocephin x 1, check a lactate at midnight  ENDOCRINE A:  Hyperglycemia  P:  Hold avapro, add SSI  NEUROLOGIC A:  Dementia w/agitation currently at baseline as per granddaughters at bedside.  Toxic metabolic encephalopathy resolved P:  Monitor, provide PRN haldol   FAMILY  - Updates: discussed care with patient and three granddaughters at the bedside. They state that his son is his POA and had the information from his living will.   - Inter-disciplinary family meet or Palliative Care meeting due by:  day 4   TODAY'S SUMMARY: pt is being admitted to ICU. GI has been consulted.     Deetta PerlaEddy Chazlyn Cude, MD  Pulmonary and Critical Care Medicine [facility] Pager: [phone]  04/14/2015, 10:45 PM   Critical Care Time 50 minutes

## 2015-04-16 ENCOUNTER — Encounter (HOSPITAL_COMMUNITY): Admission: EM | Disposition: E | Payer: Self-pay | Source: Home / Self Care | Attending: Pulmonary Disease

## 2015-04-16 ENCOUNTER — Encounter (HOSPITAL_COMMUNITY): Payer: Self-pay | Admitting: Gastroenterology

## 2015-04-16 DIAGNOSIS — T39395A Adverse effect of other nonsteroidal anti-inflammatory drugs [NSAID], initial encounter: Secondary | ICD-10-CM

## 2015-04-16 DIAGNOSIS — K922 Gastrointestinal hemorrhage, unspecified: Secondary | ICD-10-CM

## 2015-04-16 DIAGNOSIS — N179 Acute kidney failure, unspecified: Secondary | ICD-10-CM

## 2015-04-16 DIAGNOSIS — G9340 Encephalopathy, unspecified: Secondary | ICD-10-CM

## 2015-04-16 HISTORY — PX: ESOPHAGOGASTRODUODENOSCOPY: SHX5428

## 2015-04-16 LAB — URINALYSIS, ROUTINE W REFLEX MICROSCOPIC
BILIRUBIN URINE: NEGATIVE
Glucose, UA: NEGATIVE mg/dL
Ketones, ur: NEGATIVE mg/dL
Leukocytes, UA: NEGATIVE
Nitrite: NEGATIVE
PH: 5 (ref 5.0–8.0)
Protein, ur: NEGATIVE mg/dL
Specific Gravity, Urine: 1.016 (ref 1.005–1.030)
Urobilinogen, UA: 0.2 mg/dL (ref 0.0–1.0)

## 2015-04-16 LAB — GLUCOSE, CAPILLARY
Glucose-Capillary: 74 mg/dL (ref 70–99)
Glucose-Capillary: 88 mg/dL (ref 70–99)
Glucose-Capillary: 96 mg/dL (ref 70–99)

## 2015-04-16 LAB — PREPARE FRESH FROZEN PLASMA
UNIT DIVISION: 0
Unit division: 0

## 2015-04-16 LAB — BASIC METABOLIC PANEL
ANION GAP: 10 (ref 5–15)
Anion gap: 10 (ref 5–15)
BUN: 60 mg/dL — ABNORMAL HIGH (ref 6–20)
BUN: 58 mg/dL — ABNORMAL HIGH (ref 6–20)
CALCIUM: 8.2 mg/dL — AB (ref 8.9–10.3)
CO2: 22 mmol/L (ref 22–32)
CO2: 21 mmol/L — ABNORMAL LOW (ref 22–32)
CREATININE: 1.76 mg/dL — AB (ref 0.61–1.24)
CREATININE: 1.56 mg/dL — AB (ref 0.61–1.24)
Calcium: 7.9 mg/dL — ABNORMAL LOW (ref 8.9–10.3)
Chloride: 109 mmol/L (ref 101–111)
Chloride: 108 mmol/L (ref 101–111)
GFR calc Af Amer: 38 mL/min — ABNORMAL LOW (ref >60)
GFR calc non Af Amer: 33 mL/min — ABNORMAL LOW (ref >60)
GFR calc non Af Amer: 38 mL/min — ABNORMAL LOW (ref >60)
GFR, EST AFRICAN AMERICAN: 44 mL/min — AB (ref >60)
Glucose, Bld: 84 mg/dL (ref 70–99)
Glucose, Bld: 102 mg/dL — ABNORMAL HIGH (ref 70–99)
Potassium: 2.9 mmol/L — ABNORMAL LOW (ref 3.5–5.1)
Potassium: 3.2 mmol/L — ABNORMAL LOW (ref 3.5–5.1)
Sodium: 141 mmol/L (ref 135–145)
Sodium: 139 mmol/L (ref 135–145)

## 2015-04-16 LAB — HEMOGLOBIN AND HEMATOCRIT, BLOOD
HCT: 33 % — ABNORMAL LOW (ref 39.0–52.0)
Hemoglobin: 11.7 g/dL — ABNORMAL LOW (ref 13.0–17.0)

## 2015-04-16 LAB — URINE MICROSCOPIC-ADD ON

## 2015-04-16 LAB — CBC
HEMATOCRIT: 33.5 % — AB (ref 39.0–52.0)
Hemoglobin: 12.1 g/dL — ABNORMAL LOW (ref 13.0–17.0)
MCH: 30.6 pg (ref 26.0–34.0)
MCHC: 36.1 g/dL — AB (ref 30.0–36.0)
MCV: 84.6 fL (ref 78.0–100.0)
Platelets: 118 10*3/uL — ABNORMAL LOW (ref 150–400)
RBC: 3.96 MIL/uL — ABNORMAL LOW (ref 4.22–5.81)
RDW: 13.5 % (ref 11.5–15.5)
WBC: 23.1 10*3/uL — ABNORMAL HIGH (ref 4.0–10.5)

## 2015-04-16 LAB — MAGNESIUM
MAGNESIUM: 1.9 mg/dL (ref 1.7–2.4)
Magnesium: 2.5 mg/dL — ABNORMAL HIGH (ref 1.7–2.4)

## 2015-04-16 LAB — PHOSPHORUS: Phosphorus: 1.6 mg/dL — ABNORMAL LOW (ref 2.5–4.6)

## 2015-04-16 LAB — MRSA PCR SCREENING: MRSA by PCR: NEGATIVE

## 2015-04-16 LAB — LACTIC ACID, PLASMA: LACTIC ACID, VENOUS: 4.5 mmol/L — AB (ref 0.5–2.0)

## 2015-04-16 SURGERY — EGD (ESOPHAGOGASTRODUODENOSCOPY)
Anesthesia: Moderate Sedation

## 2015-04-16 MED ORDER — FENTANYL CITRATE (PF) 100 MCG/2ML IJ SOLN
INTRAMUSCULAR | Status: AC
  Filled 2015-04-16: qty 2

## 2015-04-16 MED ORDER — FENTANYL CITRATE (PF) 100 MCG/2ML IJ SOLN
INTRAMUSCULAR | Status: DC | PRN
  Administered 2015-04-16 (×2): 25 ug via INTRAVENOUS

## 2015-04-16 MED ORDER — SODIUM CHLORIDE 0.9 % IV SOLN: INTRAVENOUS | Status: DC

## 2015-04-16 MED ORDER — MAGNESIUM SULFATE 2 GM/50ML IV SOLN
2.0000 g | Freq: Once | INTRAVENOUS | Status: AC
  Administered 2015-04-16: 2 g via INTRAVENOUS
  Filled 2015-04-16: qty 50

## 2015-04-16 MED ORDER — MIDAZOLAM HCL 10 MG/2ML IJ SOLN
INTRAMUSCULAR | Status: DC | PRN
  Administered 2015-04-16: 2 mg via INTRAVENOUS

## 2015-04-16 MED ORDER — MIDAZOLAM HCL 5 MG/ML IJ SOLN
INTRAMUSCULAR | Status: AC
  Filled 2015-04-16: qty 1

## 2015-04-16 MED ORDER — INSULIN ASPART 100 UNIT/ML ~~LOC~~ SOLN: 0.0000 [IU] | SUBCUTANEOUS | Status: DC

## 2015-04-16 MED ORDER — FENTANYL CITRATE (PF) 100 MCG/2ML IJ SOLN
12.5000 ug | INTRAMUSCULAR | Status: DC | PRN
  Administered 2015-04-16: 25 ug via INTRAVENOUS
  Administered 2015-04-16 (×2): 50 ug via INTRAVENOUS
  Filled 2015-04-16 (×2): qty 2

## 2015-04-16 MED ORDER — POTASSIUM CHLORIDE 10 MEQ/100ML IV SOLN
10.0000 meq | INTRAVENOUS | Status: AC
  Administered 2015-04-16 (×6): 10 meq via INTRAVENOUS
  Filled 2015-04-16 (×6): qty 100

## 2015-04-17 ENCOUNTER — Encounter (HOSPITAL_COMMUNITY): Payer: Self-pay | Admitting: Gastroenterology

## 2015-04-18 ENCOUNTER — Telehealth: Payer: Self-pay

## 2015-04-18 LAB — TYPE AND SCREEN
ABO/RH(D): O POS
ANTIBODY SCREEN: NEGATIVE
UNIT DIVISION: 0
UNIT DIVISION: 0
Unit division: 0
Unit division: 0
Unit division: 0
Unit division: 0

## 2015-04-18 NOTE — Telephone Encounter (Signed)
On 04/18/2015 I received a death certificate [facility] This patient is a patient of Doctor McQuaid. I am taking the death certificate to the Pulmonary unit for signature.  On 04/18/2015 I received the death certificate completed.  I called the funeral home to let them know the death certificate was ready to be picked up.

## 2015-04-18 NOTE — Discharge Summary (Signed)
NAMTacey Friedman:  Travis Friedman               ACCOUNT NO.:  [id number]  MEDICAL RECORD NO.:  [id number]  LOCATION:  2S01C                        FACILITY:  [facility]  PHYSICIAN:  Veto KempsUGLAS BRENT MCQUAID, MDDATE OF BIRTH:  Apr 11, 1926  DATE OF ADMISSION:  04/30/2015 DATE OF DISCHARGE:  2015/06/06                              DISCHARGE SUMMARY   DEATH SUMMARY  CAUSE OF DEATH:  Upper GI bleed secondary to duodenal ulcer.  HISTORY OF PRESENT ILLNESS:  This is an 81 year old male with dementia and a history of diabetes, who was admitted on Apr 15, 2015, with an upper GI bleed.  His family had found him with evidence of bloody and black stools, and brought him to the emergency department.  PAST MEDICAL HISTORY:  See the admission H and P.  FAMILY HISTORY:  See the admission H and P.  SOCIAL HISTORY:  See the admission H and P.  PHYSICAL EXAMINATION:  VITAL SIGNS:  On the day of death, temperature 98.6, heart rate 69, respirations 18, blood pressure 147/66, SpO2 100% on room air. GENERAL:  Just had received sedation for EGD, comfortable in bed. HEENT:  [name].  Pupils are equal, round, reactive to light.  Oropharynx clear. PULMONARY:  Clear to auscultation bilaterally. CARDIOVASCULAR:  Regular rate and rhythm.  No murmurs, gallops, or rubs. GI:  Bowel sounds positive, soft, nontender. MUSCULOSKELETAL:  Mild muscular atrophy consistent with age.  HOSPITAL COURSE:  The patient was admitted to the intensive care unit and Gastroenterology was consulted.  Blood transfusions were administered for his rapid upper GI bleed.  An endoscopy was performed, which showed evidence of a large duodenal ulcer with a visible artery. Because of the nature of the artery, which was not actively bleeding at the time of endoscopy, end up extremely high risk of bleeding from endoscopic manipulation.  GI Medicine consulted with Interventional Radiology about possible interventions.  During this time, the patient's family  was updated about the status of the ulcer and the high risk of rebleeding.  They stated clearly that because of his advanced dementia and advanced age, they did not feel that further interventions were needed, further, they stated that the patient never really wanted to come to the hospital in the first place.  So, at this point, the patient's code status was made DNR.  Not long after that, he did have a massive GI bleed and passed on Sunday morning, Apr 16, 2015.          ______________________________ Veto KempsUGLAS BRENT MCQUAID, MD     DBM/MEDQ  D:  04/17/2015  T:  04/18/2015  Job:  [id number]

## 2015-05-10 NOTE — H&P (View-Only) (Signed)
[facility] Referring Provider: No ref. provider found Primary Care Physician:  Dr. Ramachandran Primary Gastroenterologist:  None  Reason for Consultation: Gi bleed, hypotension   HPI:  Travis Friedman is a 81 y.o. male was brought to ED by EMS after receiving a call from family stating that the patient was having a large amount of dark stool. Report obtained from granddaughter at bedside. Pt was not speaking and lethargic this morning. Daughter was called who arrived with granddaughter. They noted that the patient was minimally responsive. They called EMS and noted that he had dark black/red tarry stool.   EMS said he lost 1 liter blood at the seen, rectally.  In ER he was hypotensive, had altered mental status. He was rescusitated very nicely with 4 units PRBC, 1 unit FFP  And Hb increased from 5.7 to 12.1.  He was started on PPI gtts and brought to ICU. Overnight he's been HD stable with only smears of dark stool.  NO hematemesis.  He had CT scan for abd pains, see below.  Currently in room he tells me he feels fine and that his wife's body was taken over by demons. He does not think he needs any other testing and that we should leave him alone.     Past Medical History  Diagnosis Date  . Hypertension   . Hyperlipidemia     History reviewed. No pertinent past surgical history.  Prior to Admission medications   Medication Sig Start Date End Date Taking? Authorizing Provider  amLODipine (NORVASC) 10 MG tablet Take 10 mg by mouth daily.  04/10/15  Yes Historical Provider, MD  atorvastatin (LIPITOR) 20 MG tablet Take 20 mg by mouth daily. 02/13/15  Yes Historical Provider, MD  diclofenac (VOLTAREN) 50 MG EC tablet Take 50 mg by mouth 2 (two) times daily.  02/22/15  Yes Historical Provider, MD  donepezil (ARICEPT) 10 MG tablet Take 10 mg by mouth at bedtime.  03/27/15  Yes Historical Provider, MD  hydrochlorothiazide (HYDRODIURIL) 25 MG tablet Take 25 mg by mouth daily. 02/13/15  Yes  Historical Provider, MD  irbesartan (AVAPRO) 300 MG tablet Take 300 mg by mouth daily.  03/30/15  Yes Historical Provider, MD  lubiprostone (AMITIZA) 24 MCG capsule Take 24 mcg by mouth 2 (two) times daily with a meal.   Yes Historical Provider, MD  PARoxetine (PAXIL) 20 MG tablet Take 20 mg by mouth daily.  03/31/15  Yes Historical Provider, MD  Psyllium (METAMUCIL PO) Take by mouth daily. Mix in liquid and drink   Yes Historical Provider, MD  tamsulosin (FLOMAX) 0.4 MG CAPS capsule Take 0.4 mg by mouth daily after breakfast.  04/10/15  Yes Historical Provider, MD  traMADol (ULTRAM) 50 MG tablet Take 50-100 mg by mouth every 8 (eight) hours as needed (pain).  03/27/15  Yes Historical Provider, MD    Current Facility-Administered Medications  Medication Dose Route Frequency Provider Last Rate Last Dose  . 0.9 %  sodium chloride infusion  250 mL Intravenous PRN Eddy J Gutierrez, MD 10 mL/hr at 04/30/2015 0400 250 mL at 04/10/2015 0400  . cefTRIAXone (ROCEPHIN) 1 g in dextrose 5 % 50 mL IVPB  1 g Intravenous Q12H Joshua Zavitz, MD   Stopped at 04/23/2015 2258  . fentaNYL (SUBLIMAZE) injection 12.5-50 mcg  12.5-50 mcg Intravenous Q2H PRN Elizabeth C Deterding, MD   25 mcg at 04/30/2015 0611  . haloperidol lactate (HALDOL) injection 5 mg  5 mg Intravenous Q6H PRN Eddy J Gutierrez, MD      .   insulin aspart (novoLOG) injection 0-15 Units  0-15 Units Subcutaneous 6 times per day Elizabeth C Deterding, MD   0 Units at 04/15/2015 0045  . pantoprazole (PROTONIX) 80 mg in sodium chloride 0.9 % 250 mL (0.32 mg/mL) infusion  8 mg/hr Intravenous Continuous Joshua Zavitz, MD 25 mL/hr at 05/03/2015 0653 8 mg/hr at 04/23/2015 0653  . potassium chloride 10 mEq in 100 mL IVPB  10 mEq Intravenous Q1 Hr x 6 Elizabeth C Deterding, MD   10 mEq at 04/25/2015 0653    Allergies as of 04/19/2015  . (No Known Allergies)    History reviewed. No pertinent family history.  History   Social History  . Marital Status: Married    Spouse  Name: N/A  . Number of Children: N/A  . Years of Education: N/A   Occupational History  . Not on file.   Social History Main Topics  . Smoking status: Unknown If Ever Smoked  . Smokeless tobacco: Not on file  . Alcohol Use: Not on file     Comment: unknown  . Drug Use: Not on file     Comment: unknown  . Sexual Activity: Not on file   Other Topics Concern  . Not on file   Social History Narrative  . No narrative on file     Review of Systems: Pertinent positive and negative review of systems were noted in the above HPI section. Complete review of systems was performed and was otherwise normal.   Physical Exam: Vital signs in last 24 hours: Temp:  [93.8 F (34.3 C)-97.9 F (36.6 C)] 97.9 F (36.6 C) (05/08 0400) Pulse Rate:  [71-98] 74 (05/08 0700) Resp:  [13-27] 20 (05/08 0700) BP: (57-150)/(35-102) 144/55 mmHg (05/08 0700) SpO2:  [93 %-100 %] 100 % (05/08 0700) Weight:  [146 lb 6.2 oz (66.4 kg)] 146 lb 6.2 oz (66.4 kg) (05/08 0414) Last BM Date: 04/29/2015 Constitutional: generally well-appearing Psychiatric: alert and oriented x3 Eyes: extraocular movements intact Mouth: oral pharynx moist, no lesions Neck: supple no lymphadenopathy Cardiovascular: heart regular rate and rhythm Lungs: clear to auscultation bilaterally Abdomen: soft, nontender, nondistended, no obvious ascites, no peritoneal signs, normal bowel sounds Extremities: no lower extremity edema bilaterally Skin: no lesions on visible extremities   Intake/Output from previous day: 05/07 0701 - 05/08 0700 In: 1465 [I.V.:815; IV Piggyback:650] Out: 660 [Urine:660] Intake/Output this shift:    Lab Results:  Recent Labs  05/04/2015 2000 04/10/2015 2003 04/22/2015 2213 05/04/2015 0104 04/29/2015 0607  WBC 12.4* 20.0*  --   --  23.1*  HGB 5.7* 12.0* 12.2* 11.7* 12.1*  HCT 17.0* 35.0* 36.0* 33.0* 33.5*  PLT 147* 111*  --   --  118*  MCV 89.9 89.3  --   --  84.6   BMET  Recent Labs  05/08/2015 2000  04/14/2015 2213 04/24/2015 0104 05/05/2015 0607  NA 135 144 141 139  K 2.4* 3.1* 2.9* 3.2*  CL 109 106 109 108  CO2 16*  --  22 21*  GLUCOSE 232* 166* 84 102*  BUN 64* 57* 60* 58*  CREATININE 2.20* 1.80* 1.76* 1.56*  CALCIUM 7.4*  --  7.9* 8.2*   LFT  Recent Labs  05/04/2015 2000  BILITOT 0.6  AST 20  ALT 10*  ALKPHOS 47  PROT 4.3*  ALBUMIN 2.0*   PT/INR  Recent Labs  05/06/2015 2000  LABPROT 16.7*  INR 1.34   Imaging/Other Results: Ct Abdomen Pelvis Wo Contrast  04/15/2015   CLINICAL DATA:  GI   bleeding.  Altered mental status.  EXAM: CT ABDOMEN AND PELVIS WITHOUT CONTRAST  TECHNIQUE: Multidetector CT imaging of the abdomen and pelvis was performed following the standard protocol without IV contrast.  COMPARISON:  03/23/2015  FINDINGS: There is moderate dilatation of proximal and mid small bowel. There is pneumatosis involving some of the dilated small bowel loops. There may also be pneumatosis of the gastric wall. The findings are concerning for ischemic bowel. No free peritoneal air is evident. No portal venous air is evident.  There are unremarkable unenhanced appearances of the liver, spleen, pancreas, adrenals and kidneys. No significant ascites collections. Extensive atherosclerotic plaque is present throughout the aorta. Patency of the mesenteric vessels cannot be established in the absence of intravenous contrast.  There is a right lower quadrant hernia containing unobstructed distal small bowel. Mild atelectatic appearing airspace opacity is present in the right lung base.  IMPRESSION: Small bowel dilatation and pneumatosis, concerning for bowel ischemia. These results were called by telephone at the time of interpretation on 04/21/2015 at 12:39 am to Dr. Elizabeth Deterding, who verbally acknowledged these results.   Electronically Signed   By: Kyrene Longan R Mitchell M.D.   On: 04/22/2015 00:40     Impression/Plan: 81 y.o. male with GI bleeding, unclear etiology  Signficant GI bleed  that he has survived, unclear etiology. The scan above suggests SB pneumatosis and possibly gastric as well. Not sure if that ischemic damage is the cause of his acute illness or the result of massive bleeding.  He not a good historian and cannot give consent to testing, procedures (baseline dementia).  I spoke with his son Bobby on phone (who I am told has his POA) and he consented to EGD this morning.  For now he should stay on IV PPI drip and be observed in ICU.  Agree with broad spect Abx for now as well.  Prabhleen Montemayor P, MD  04/20/2015, 7:19 AM  Gastroenterology Pager [phone]    

## 2015-05-10 NOTE — Op Note (Signed)
PATIENT: Travis Friedman, Travis Friedman  MR#: [id number]  BIRTHDATE: 28-Mar-1926 , 89  yrs. old GENDER: male  ENDOSCOPIST: Rachael Feeaniel P Jacobs MD  PROCEDURE DATE:  05/09/2015  PROCEDURE:  EGD, diagnostic    ASA CLASS:     Class IV  INDICATIONS:  near syncope with large red and dark rectal bleeding last night.   MEDICATIONS: Fentanyl 50 mcg IV and Versed 3 mg IV     TOPICAL ANESTHETIC: none   DESCRIPTION OF PROCEDURE: After the risks benefits and alternatives of the procedure were thoroughly explained, informed consent was obtained.  The Pentax Gastroscope [id number] endoscope was introduced through the mouth and advanced to the second portion of the duodenum , Without limitations.  The instrument was slowly withdrawn as the mucosa was fully examined. images   There was some old blood clot and dark blood in the stomach, small amount.  The gastric mucosa was normal appearing, no necrosis or obvious ischemic compromise.  There was a very large (2-3cm), posterior wall duodenal bulb ulcer.  Within the ulcer crater was a 4-616mm (large) obvious visible vessle with a pulse.  I am suspicious that this is the GDA.  I was very concerned that any therapy (injection, clipping, cautery) would precipitate endoscopically uncontrollable bleeding.  The scope was then withdrawn.  Retroflexed views revealed no abnormalities.     The scope was then withdrawn from the patient and the procedure completed. COMPLICATIONS: There were no immediate complications.      ENDOSCOPIC IMPRESSION: There was some old blood clot and dark blood in the stomach, small amount.  The gastric mucosa was normal appearing, no necrosis or obvious ischemic compromise.  There was a very large (2-3cm), posterior wall duodenal bulb ulcer.  Within the ulcer crater was a 4-406mm (large) obvious visible vessle with a pulse.  I am suspicious that this is the GDA.  I was very concerned that any therapy (injection, clipping, cautery) would precipitate endoscopically  uncontrollable bleeding.  The scope was then withdrawn     RECOMMENDATIONS: Large duodenal ulcer with large visible pulsating but not actively bleeding vessel.  I did not treat the vessel, see above.  I discussed with his son, Travis Friedman [facility](POA), on the phone and he understands that the vessel may bleed again.  I described angiogram and surgery as ways that could potentially stop the bleeding if it does recur.  He prefers that nothing be done.  His father is 3588, has dementia, has been declining from that perspective a lot recently.  He and family plan to visit later this morning.  Will continue IV PPT gtts for another 48-72 hours.  NPO except sips H20 for now.    The original pentax report was not saved for technical reasons, there were several pictures taken.

## 2015-05-10 NOTE — Plan of Care (Signed)
Problem: Consults Goal: Skin Care Protocol Initiated - if Braden Score 18 or less If consults are not indicated, leave blank or document N/A Outcome: Progressing Sacral dressing placed and protocol started  Problem: Phase I Progression Outcomes Goal: Pain controlled with appropriate interventions Outcome: Not Applicable Date Met:  04/17/2015 No complaints of pain at this time Goal: Voiding-avoid urinary catheter unless indicated Outcome: Progressing Condom catheter in place Goal: Hemodynamically stable Outcome: Progressing Vital signs stable at this time     

## 2015-05-10 NOTE — Progress Notes (Signed)
PULMONARY / CRITICAL CARE MEDICINE   Name: Travis Friedman MRN: [id number] DOB: 12/23/1925    ADMISSION DATE:  04/19/2015 CONSULTATION DATE:  05/09/2015  REFERRING MD :  Dr. Jodi MourningZavitz, ED  CHIEF COMPLAINT:  GI bleed  INITIAL PRESENTATION:  81 y/o male with diabetes admitted [facility] 5/8 [facility] with an acute upper GI bleed.   STUDIES: CT Abdomen 5/8 AM > small bowel dilatation and pneumatosis worrisome for bowel ischemia  SIGNIFICANT EVENTS: Pt has received 4L of PRBC and 1U FFP. Cordis was placed by the ED resident.  EGD 5/8 > large duodenal ulcer with visible vessel  VITAL SIGNS: Temp:  [93.8 F (34.3 C)-98.6 F (37 C)] 98.6 F (37 C) (05/08 0800) Pulse Rate:  [69-98] 69 (05/08 0900) Resp:  [13-27] 18 (05/08 0900) BP: (57-159)/(35-102) 147/66 mmHg (05/08 0900) SpO2:  [93 %-100 %] 100 % (05/08 0900) Weight:  [146 lb 6.2 oz (66.4 kg)] 146 lb 6.2 oz (66.4 kg) (05/08 0414) HEMODYNAMICS:   VENTILATOR SETTINGS:   INTAKE / OUTPUT:  Intake/Output Summary (Last 24 hours) at 04/09/2015 0926 Last data filed at 04/09/2015 0900  Gross per 24 hour  Intake   1635 ml  Output    760 ml  Net    875 ml    PHYSICAL EXAMINATION: General:  Just received sedation for EGD, comfortable in bed HENT: NCAT, OP clear PULM: CTA B CV: RRR, no mgr GI: BS+, soft, nontender MSK: mild muscular atrophy consistent with age Neuro: sedated, stirs to touch  LABS:  CBC  Recent Labs Lab 04/19/2015 2000 04/10/2015 2003 05/03/2015 2213 04/09/2015 0104 04/09/2015 0607  WBC 12.4* 20.0*  --   --  23.1*  HGB 5.7* 12.0* 12.2* 11.7* 12.1*  HCT 17.0* 35.0* 36.0* 33.0* 33.5*  PLT 147* 111*  --   --  118*   Coag's  Recent Labs Lab 05/09/2015 2000  INR 1.34   BMET  Recent Labs Lab 05/04/2015 2000 05/06/2015 2213 04/09/2015 0104 04/09/2015 0607  NA 135 144 141 139  K 2.4* 3.1* 2.9* 3.2*  CL 109 106 109 108  CO2 16*  --  22 21*  BUN 64* 57* 60* 58*  CREATININE 2.20* 1.80* 1.76* 1.56*  GLUCOSE 232* 166* 84 102*    Electrolytes  Recent Labs Lab 05/07/2015 2000 04/09/2015 0104 04/09/2015 0607  CALCIUM 7.4* 7.9* 8.2*  MG  --  1.9 2.5*  PHOS  --   --  1.6*   Sepsis Markers  Recent Labs Lab 04/10/2015 2000 04/10/2015 2332  LATICACIDVEN 8.59* 4.5*   ABG No results for input(s): PHART, PCO2ART, PO2ART in the last 168 hours. Liver Enzymes  Recent Labs Lab 04/19/2015 2000  AST 20  ALT 10*  ALKPHOS 47  BILITOT 0.6  ALBUMIN 2.0*   Cardiac Enzymes No results for input(s): TROPONINI, PROBNP in the last 168 hours. Glucose  Recent Labs Lab 04/09/2015 0039 04/09/2015 0359  GLUCAP 74 88    Imaging Ct Abdomen Pelvis Wo Contrast  04/25/2015   CLINICAL DATA:  GI bleeding.  Altered mental status.  EXAM: CT ABDOMEN AND PELVIS WITHOUT CONTRAST  TECHNIQUE: Multidetector CT imaging of the abdomen and pelvis was performed following the standard protocol without IV contrast.  COMPARISON:  03/23/2015  FINDINGS: There is moderate dilatation of proximal and mid small bowel. There is pneumatosis involving some of the dilated small bowel loops. There may also be pneumatosis of the gastric wall. The findings are concerning for ischemic bowel. No free peritoneal air  is evident. No portal venous air is evident.  There are unremarkable unenhanced appearances of the liver, spleen, pancreas, adrenals and kidneys. No significant ascites collections. Extensive atherosclerotic plaque is present throughout the aorta. Patency of the mesenteric vessels cannot be established in the absence of intravenous contrast.  There is a right lower quadrant hernia containing unobstructed distal small bowel. Mild atelectatic appearing airspace opacity is present in the right lung base.  IMPRESSION: Small bowel dilatation and pneumatosis, concerning for bowel ischemia. These results were called by telephone at the time of interpretation on 04/23/2015 at 12:39 am to Dr. Shelba FlakeElizabeth Deterding, who verbally acknowledged these results.   Electronically Signed    By: Ellery Plunkaniel R Mitchell M.D.   On: 03-21-15 00:40     ASSESSMENT / PLAN:  GASTROINTESTINAL A:   GI bleed due to large duodenal bleed with visible ulcer > high risk for re-bleeding On NSAIDs at home Worrisome for bowel ischemia based on CT > but endoscopy looked normal in stomach, not sure if there is distal small bowel ischemia> he is a poor surgical candidate due to age, dementia P:   continue PPI drip, serial Hb, serial lactic acid Hold on CT angiogram as Cr 1.56 IR if re-bleeds  HEMATOLOGIC A:   Acute blood loss anemia 2/2 GI Bleed Thrombocytopenia, mild, likely 2/2 consumption P:   Will trend Hgb q6h Hgb transfusion if Hgb < 8 if bleeding  PULMONARY A:  No active issues  P:   Monitor O2 saturation  CARDIOVASCULAR A:  Hemorrhagic shock on admission> improving P:  Continue to hold hold home BM meds Tele  RENAL A:   AKI on likely CKD, unknown stage and unknown baseline> improving Hypokalemia> replete P:   KCl now Monitor BMET and UOP (repeat at 1200 today) Replace electrolytes as needed  INFECTIOUS A:  No acute issues  P:   S/p rocephin x 1 (reason?) > will d/c  ENDOCRINE A:   Hyperglycemia  P:   SSI  NEUROLOGIC A:   Dementia w/agitation currently at baseline as per granddaughters at bedside.  Toxic metabolic encephalopathy in setting of acute illness P:   Monitor, provide PRN haldol Minimize sedating meds/benzo  FAMILY  - Updates: [facility] morning as he is asleep, however spoke with Tiffany, granddaughter and explained the severity of the situation, details.  Need to discuss code status when son is here  CC time 45 minutes  Heber CarolinaBrent Javonda Suh, MD [place] [facility] Pager: [phone] Cell: [phone] If no response, call [phone]

## 2015-05-10 NOTE — Interval H&P Note (Signed)
History and Physical Interval Note:  04/18/2015 8:48 AM  Travis Friedman  has presented today for surgery, with the diagnosis of gi bleeding  The various methods of treatment have been discussed with the patient and family. After consideration of risks, benefits and other options for treatment, the patient has consented to  Procedure(s) with comments: ESOPHAGOGASTRODUODENOSCOPY (EGD) (N/A) - bedside as a surgical intervention .  The patient's history has been reviewed, patient examined, no change in status, stable for surgery.  I have reviewed the patient's chart and labs.  Questions were answered to the patient's satisfaction.     Rachael FeeJacobs, Daniel P

## 2015-05-10 NOTE — Progress Notes (Signed)
eLink Physician-Brief Progress Note Patient Name: Pasty ArchJames N Cushman DOB: 01/02/1926 MRN: [id number]   Date of Service  04/28/2015  HPI/Events of Note  Hyperglycemia  eICU Interventions  Placed on SSI q4 hours - moderate scale     Intervention Category Intermediate Interventions: Hyperglycemia - evaluation and treatment  DETERDING,ELIZABETH 04/22/2015, 12:34 AM

## 2015-05-10 NOTE — Progress Notes (Signed)
eLink Physician-Brief Progress Note Patient Name: Travis Friedman DOB: 09/02/1926 MRN: [id number]   Date of Service  05/02/2015  HPI/Events of Note  Patient c/o of abd pain.  Is HD stable and alert.  eICU Interventions  Plan: Fentanyl 12.5 to 50 mcg q2 hours prn pain Evaluate on rounds for change in clinical abd exam.     Intervention Category Intermediate Interventions: Pain - evaluation and management  DETERDING,ELIZABETH 05/07/2015, 6:04 AM

## 2015-05-10 NOTE — Progress Notes (Signed)
10am patient became very restless, trying to get up, complaining of excruciating pain. Fentanyl and Haldol given. At 1020, patients breathing became very labored and there was a moderate amount of bright red blood in mouth and back of throat. Suctioned nearly 50cc blood from mouth with minimal improvement in work of breathing and "gurlging." BP at this time 30-40s/20-30s. Dr. Kendrick FriesMcQuaid notified and states to keep the patient comfortable at this time. Family notified as well. Patient continued to digress- asystole, absent pulses, absent heart and lung sounds. Time of death: [id number]. Dr.McQuaid made aware. Post-mortem checklist being completed, family at bedside and accepting of situation.   Wilmon ArmsLaura Damarco Keysor, RN

## 2015-05-10 NOTE — Progress Notes (Signed)
[facility]   Bobby [facility](HCPOA, son) called back and said that he understands the gravity of the situation and he asks that we not perform any more procedures.  His father really didn't want to come to the hospital and they understand that the risks of further invasive procedures are too high to justify any possible benefits.  They would not want him to undergo CPR or go on a ventilator if he had cardio-pulmonary arrest.  DNR order written  Heber CarolinaBrent Dulcinea Kinser, MD [place] [facility] Pager: [phone] Cell: [phone] If no response, call [phone]

## 2015-05-10 NOTE — Progress Notes (Signed)
CRITICAL VALUE ALERT  Critical value received:  Lactic Acid 4.5  Date of notification:  05/08/2015  Critical value read back:yes  Nurse who received alert:  Devona KonigAvery Cayleigh Paull  MD notified (1st page):  MD Deterding notified, no further orders at this time.  Will continue to monitor carefully.

## 2015-05-10 NOTE — Consult Note (Signed)
[facility] Referring Provider: No ref. provider found Primary Care Physician:  Dr. Nicholos Johns Primary Gastroenterologist:  None  Reason for Consultation: Gi bleed, hypotension   HPI:  Travis Friedman is a 81 y.o. male was brought to ED by EMS after receiving a call from family stating that the patient was having a large amount of dark stool. Report obtained from granddaughter at bedside. Pt was not speaking and lethargic this morning. Daughter was called who arrived with granddaughter. They noted that the patient was minimally responsive. They called EMS and noted that he had dark black/red tarry stool.   EMS said he lost 1 liter blood at the seen, rectally.  In ER he was hypotensive, had altered mental status. He was rescusitated very nicely with 4 units PRBC, 1 unit FFP  And Hb increased from 5.7 to 12.1.  He was started on PPI gtts and brought to ICU. Overnight he's been HD stable with only smears of dark stool.  NO hematemesis.  He had CT scan for abd pains, see below.  Currently in room he tells me he feels fine and that his wife's body was taken over by demons. He does not think he needs any other testing and that we should leave him alone.     Past Medical History  Diagnosis Date  . Hypertension   . Hyperlipidemia     History reviewed. No pertinent past surgical history.  Prior to Admission medications   Medication Sig Start Date End Date Taking? Authorizing Provider  amLODipine (NORVASC) 10 MG tablet Take 10 mg by mouth daily.  04/10/15  Yes Historical Provider, MD  atorvastatin (LIPITOR) 20 MG tablet Take 20 mg by mouth daily. 02/13/15  Yes Historical Provider, MD  diclofenac (VOLTAREN) 50 MG EC tablet Take 50 mg by mouth 2 (two) times daily.  02/22/15  Yes Historical Provider, MD  donepezil (ARICEPT) 10 MG tablet Take 10 mg by mouth at bedtime.  03/27/15  Yes Historical Provider, MD  hydrochlorothiazide (HYDRODIURIL) 25 MG tablet Take 25 mg by mouth daily. 02/13/15  Yes  Historical Provider, MD  irbesartan (AVAPRO) 300 MG tablet Take 300 mg by mouth daily.  03/30/15  Yes Historical Provider, MD  lubiprostone (AMITIZA) 24 MCG capsule Take 24 mcg by mouth 2 (two) times daily with a meal.   Yes Historical Provider, MD  PARoxetine (PAXIL) 20 MG tablet Take 20 mg by mouth daily.  03/31/15  Yes Historical Provider, MD  Psyllium (METAMUCIL PO) Take by mouth daily. Mix in liquid and drink   Yes Historical Provider, MD  tamsulosin (FLOMAX) 0.4 MG CAPS capsule Take 0.4 mg by mouth daily after breakfast.  04/10/15  Yes Historical Provider, MD  traMADol (ULTRAM) 50 MG tablet Take 50-100 mg by mouth every 8 (eight) hours as needed (pain).  03/27/15  Yes Historical Provider, MD    Current Facility-Administered Medications  Medication Dose Route Frequency Provider Last Rate Last Dose  . 0.9 %  sodium chloride infusion  250 mL Intravenous PRN Emeterio Reeve, MD 10 mL/hr at 05/07/2015 0400 250 mL at 04/25/2015 0400  . cefTRIAXone (ROCEPHIN) 1 g in dextrose 5 % 50 mL IVPB  1 g Intravenous Q12H Blane Ohara, MD   Stopped at 05-03-15 2258  . fentaNYL (SUBLIMAZE) injection 12.5-50 mcg  12.5-50 mcg Intravenous Q2H PRN Zigmund Gottron, MD   25 mcg at 05/03/2015 [phone]  . haloperidol lactate (HALDOL) injection 5 mg  5 mg Intravenous Q6H PRN Emeterio Reeve, MD      .  insulin aspart (novoLOG) injection 0-15 Units  0-15 Units Subcutaneous 6 times per day Zigmund GottronElizabeth C Deterding, MD   0 Units at 05/02/2015 0045  . pantoprazole (PROTONIX) 80 mg in sodium chloride 0.9 % 250 mL (0.32 mg/mL) infusion  8 mg/hr Intravenous Continuous Blane OharaJoshua Zavitz, MD 25 mL/hr at 04/09/2015 0653 8 mg/hr at 04/22/2015 0653  . potassium chloride 10 mEq in 100 mL IVPB  10 mEq Intravenous Q1 Hr x 6 Zigmund GottronElizabeth C Deterding, MD   10 mEq at 04/10/2015 0653    Allergies as of 11-08-2015  . (No Known Allergies)    History reviewed. No pertinent family history.  History   Social History  . Marital Status: Married    Spouse  Name: N/A  . Number of Children: N/A  . Years of Education: N/A   Occupational History  . Not on file.   Social History Main Topics  . Smoking status: Unknown If Ever Smoked  . Smokeless tobacco: Not on file  . Alcohol Use: Not on file     Comment: unknown  . Drug Use: Not on file     Comment: unknown  . Sexual Activity: Not on file   Other Topics Concern  . Not on file   Social History Narrative  . No narrative on file     Review of Systems: Pertinent positive and negative review of systems were noted in the above HPI section. Complete review of systems was performed and was otherwise normal.   Physical Exam: Vital signs in last 24 hours: Temp:  [93.8 F (34.3 C)-97.9 F (36.6 C)] 97.9 F (36.6 C) (05/08 0400) Pulse Rate:  [71-98] 74 (05/08 0700) Resp:  [13-27] 20 (05/08 0700) BP: (57-150)/(35-102) 144/55 mmHg (05/08 0700) SpO2:  [93 %-100 %] 100 % (05/08 0700) Weight:  [146 lb 6.2 oz (66.4 kg)] 146 lb 6.2 oz (66.4 kg) (05/08 0414) Last BM Date: 04/26/2015 Constitutional: generally well-appearing Psychiatric: alert and oriented x3 Eyes: extraocular movements intact Mouth: oral pharynx moist, no lesions Neck: supple no lymphadenopathy Cardiovascular: heart regular rate and rhythm Lungs: clear to auscultation bilaterally Abdomen: soft, nontender, nondistended, no obvious ascites, no peritoneal signs, normal bowel sounds Extremities: no lower extremity edema bilaterally Skin: no lesions on visible extremities   Intake/Output from previous day: 05/07 0701 - 05/08 0700 In: 1465 [I.V.:815; IV Piggyback:650] Out: 660 [Urine:660] Intake/Output this shift:    Lab Results:  Recent Labs  June 07, 2015 2000 June 07, 2015 2003 June 07, 2015 2213 04/25/2015 0104 04/30/2015 0607  WBC 12.4* 20.0*  --   --  23.1*  HGB 5.7* 12.0* 12.2* 11.7* 12.1*  HCT 17.0* 35.0* 36.0* 33.0* 33.5*  PLT 147* 111*  --   --  118*  MCV 89.9 89.3  --   --  84.6   BMET  Recent Labs  June 07, 2015 2000  June 07, 2015 2213 05/04/2015 0104 05/01/2015 0607  NA 135 144 141 139  K 2.4* 3.1* 2.9* 3.2*  CL 109 106 109 108  CO2 16*  --  22 21*  GLUCOSE 232* 166* 84 102*  BUN 64* 57* 60* 58*  CREATININE 2.20* 1.80* 1.76* 1.56*  CALCIUM 7.4*  --  7.9* 8.2*   LFT  Recent Labs  June 07, 2015 2000  BILITOT 0.6  AST 20  ALT 10*  ALKPHOS 47  PROT 4.3*  ALBUMIN 2.0*   PT/INR  Recent Labs  June 07, 2015 2000  LABPROT 16.7*  INR 1.34   Imaging/Other Results: Ct Abdomen Pelvis Wo Contrast  05/05/2015   CLINICAL DATA:  GI  bleeding.  Altered mental status.  EXAM: CT ABDOMEN AND PELVIS WITHOUT CONTRAST  TECHNIQUE: Multidetector CT imaging of the abdomen and pelvis was performed following the standard protocol without IV contrast.  COMPARISON:  03/23/2015  FINDINGS: There is moderate dilatation of proximal and mid small bowel. There is pneumatosis involving some of the dilated small bowel loops. There may also be pneumatosis of the gastric wall. The findings are concerning for ischemic bowel. No free peritoneal air is evident. No portal venous air is evident.  There are unremarkable unenhanced appearances of the liver, spleen, pancreas, adrenals and kidneys. No significant ascites collections. Extensive atherosclerotic plaque is present throughout the aorta. Patency of the mesenteric vessels cannot be established in the absence of intravenous contrast.  There is a right lower quadrant hernia containing unobstructed distal small bowel. Mild atelectatic appearing airspace opacity is present in the right lung base.  IMPRESSION: Small bowel dilatation and pneumatosis, concerning for bowel ischemia. These results were called by telephone at the time of interpretation on 04/22/2015 at 12:39 am to Dr. Shelba FlakeElizabeth Friedman, who verbally acknowledged these results.   Electronically Signed   By: Travis Friedman M.D.   On: 2015-09-02 00:40     Impression/Plan: 81 y.o. male with GI bleeding, unclear etiology  Signficant GI bleed  that he has survived, unclear etiology. The scan above suggests SB pneumatosis and possibly gastric as well. Not sure if that ischemic damage is the cause of his acute illness or the result of massive bleeding.  He not a good historian and cannot give consent to testing, procedures (baseline dementia).  I spoke with his son Travis Friedman on phone (who I am told has his POA) and he consented to EGD this morning.  For now he should stay on IV PPI drip and be observed in ICU.  Agree with broad spect Abx for now as well.  Rachael FeeJacobs, Eswin Worrell P, MD  04/20/2015, 7:19 AM [facility] Pager [phone]

## 2015-05-10 NOTE — Progress Notes (Signed)
eLink Physician-Brief Progress Note Patient Name: Travis Friedman DOB: 07/22/1926 MRN: [id number]   Date of Service  05/08/2015  HPI/Events of Note  Hypokalemia and moderately low mag  eICU Interventions  Plan: Potassium and Magnesium replaced     Intervention Category Major Interventions: Electrolyte abnormality - evaluation and management  DETERDING,ELIZABETH 04/24/2015, 2:06 AM

## 2015-05-10 DEATH — deceased

## 2017-03-22 IMAGING — CT CT ABD-PELV W/O CM
2 of 4 series · 14 of 46 positions shown, 16 images · non-contrast
Comparison: 03/23/2015

CLINICAL DATA: GI bleeding.  Altered mental status.

EXAM:
CT ABDOMEN AND PELVIS WITHOUT CONTRAST
TECHNIQUE: Multidetector CT imaging of the abdomen and pelvis was performed
following the standard protocol without IV contrast.

[Series 201: routine, idose (2) · axial · 0.68mm/px · z∈[-7,+348]mm · 11 of 83 slices shown, 13 images]
[im 8/83  soft-tissue]
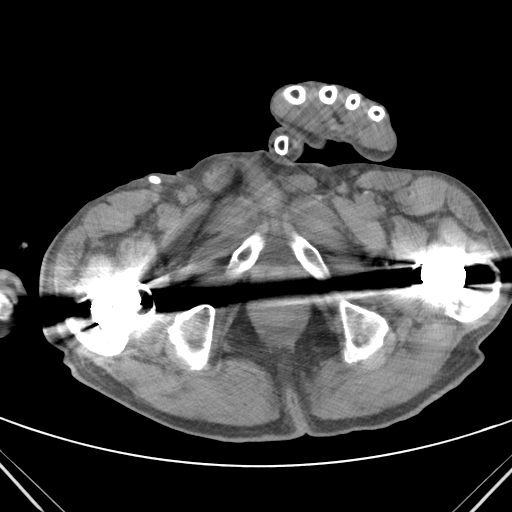
[im 8/83  bone]
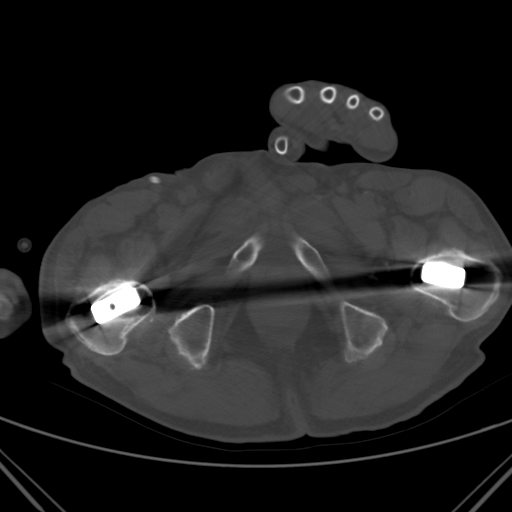
[im 15/83  soft-tissue]
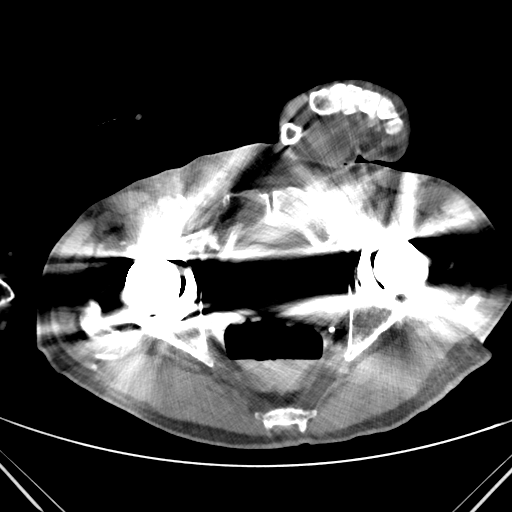
[im 22/83  soft-tissue]
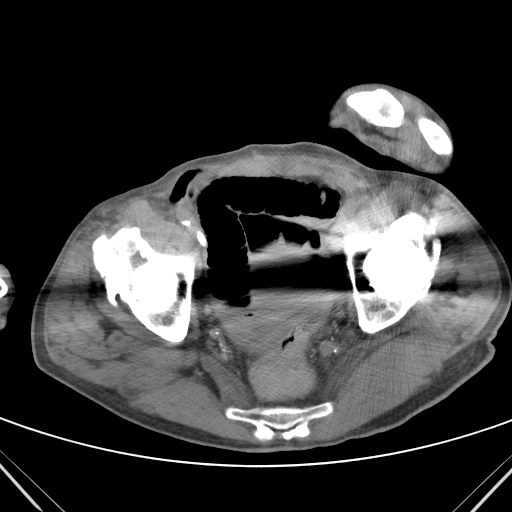
[im 29/83  soft-tissue]
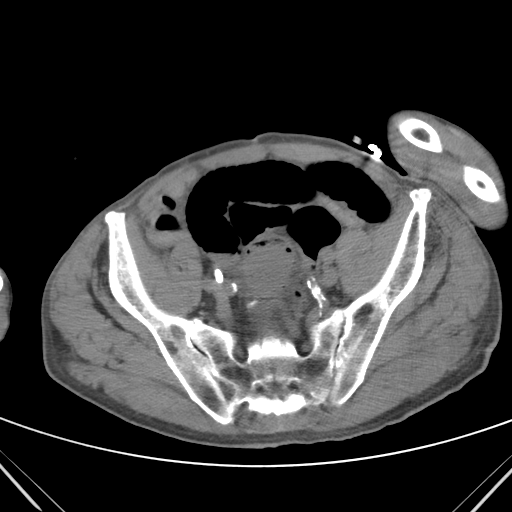
[im 36/83  soft-tissue]
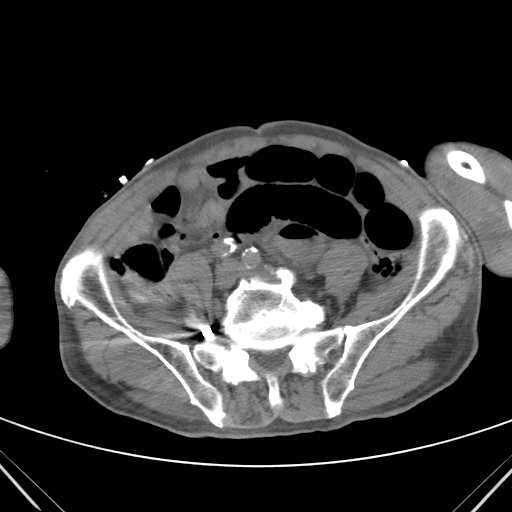
[im 43/83  soft-tissue]
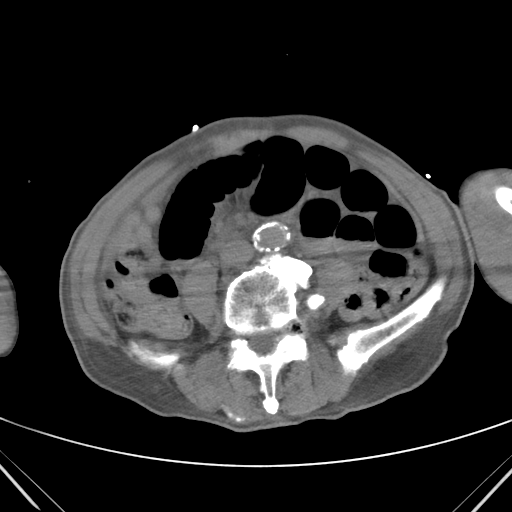
[im 50/83  soft-tissue]
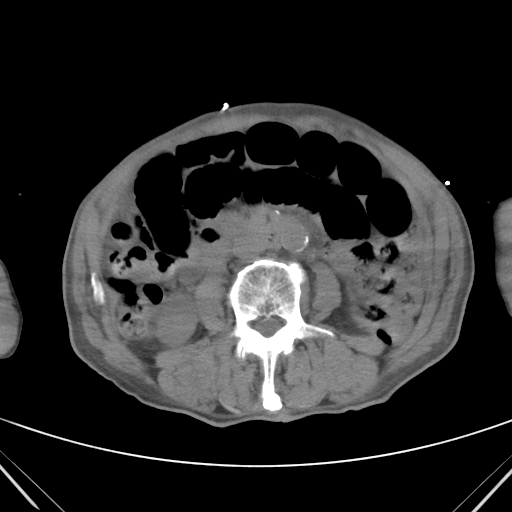
[im 58/83  soft-tissue]
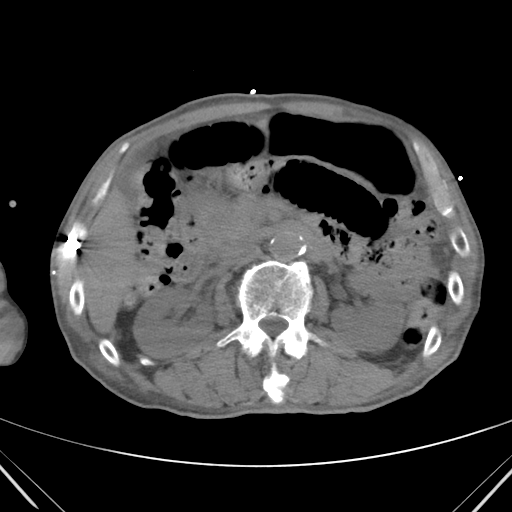
[im 65/83  soft-tissue]
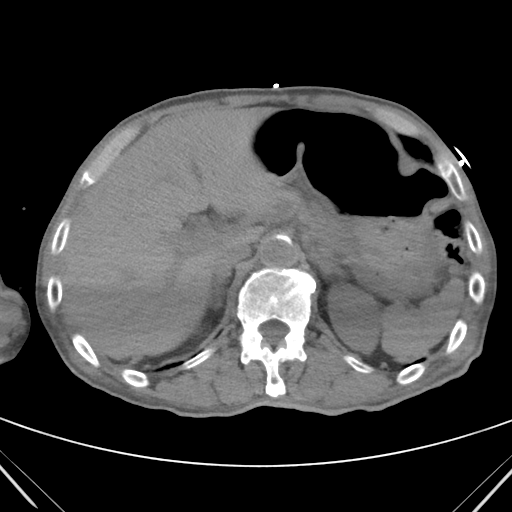
[im 65/83  bone]
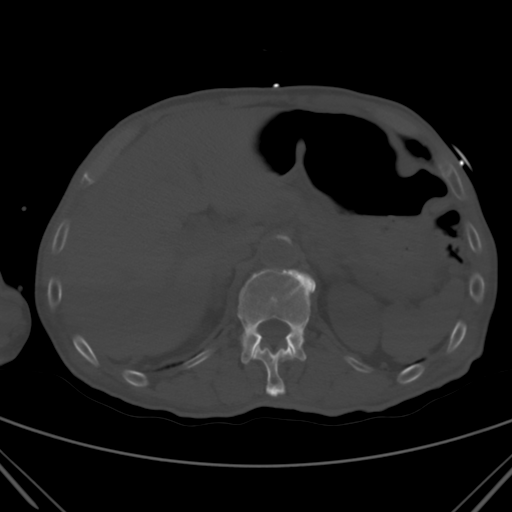
[im 72/83  soft-tissue]
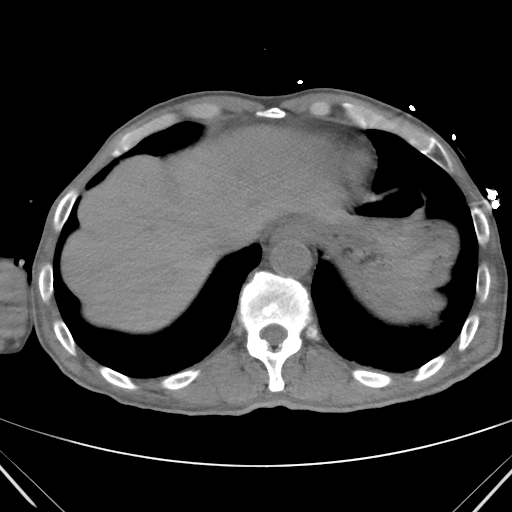
[im 79/83  soft-tissue]
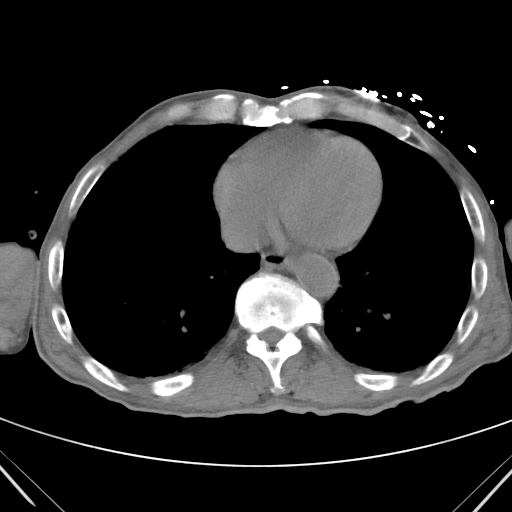

[Series 202: coronals, idose (2) · coronal · 0.50mm/px · 3 of 99 slices shown]
[im 33/99  soft-tissue]
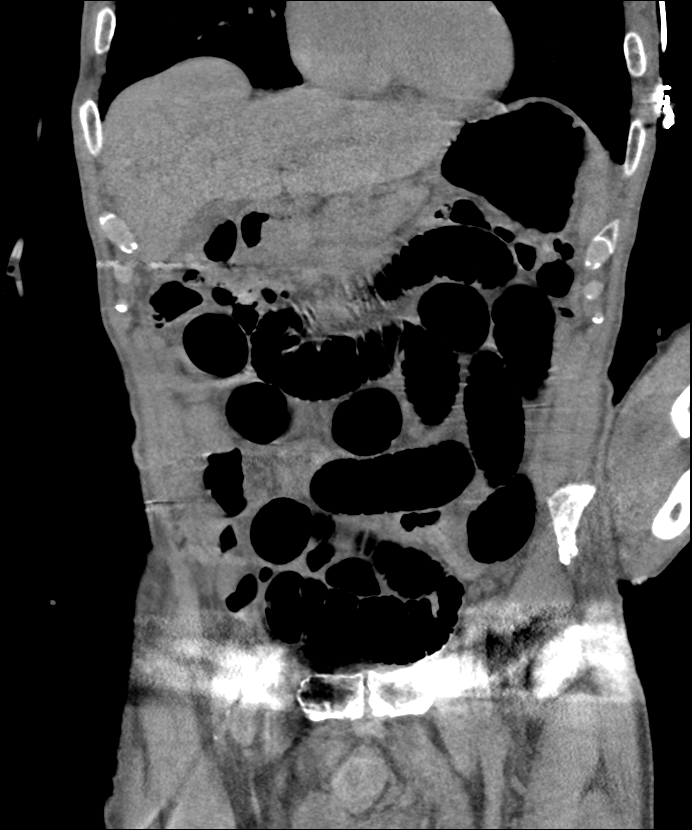
[im 44/99  soft-tissue]
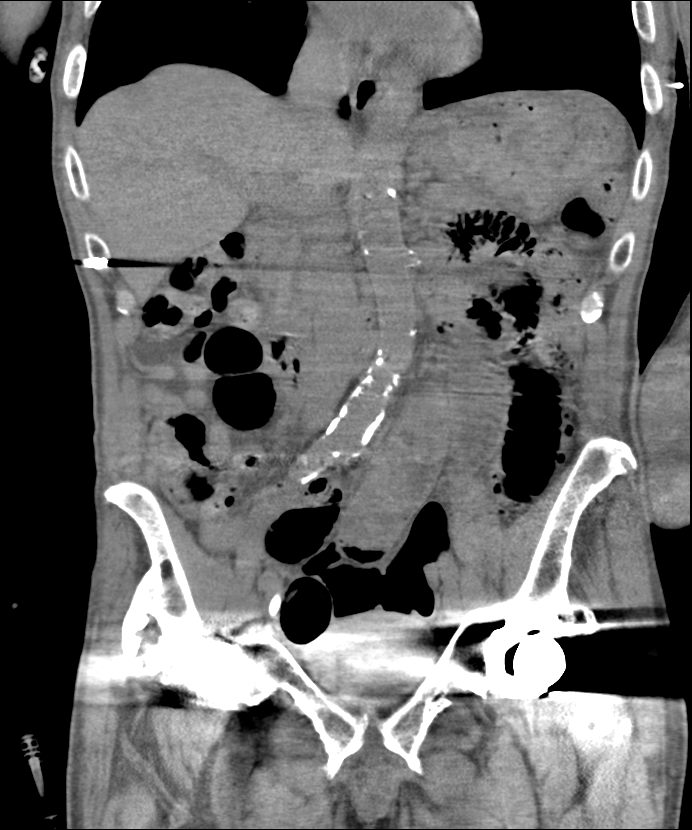
[im 55/99  soft-tissue]
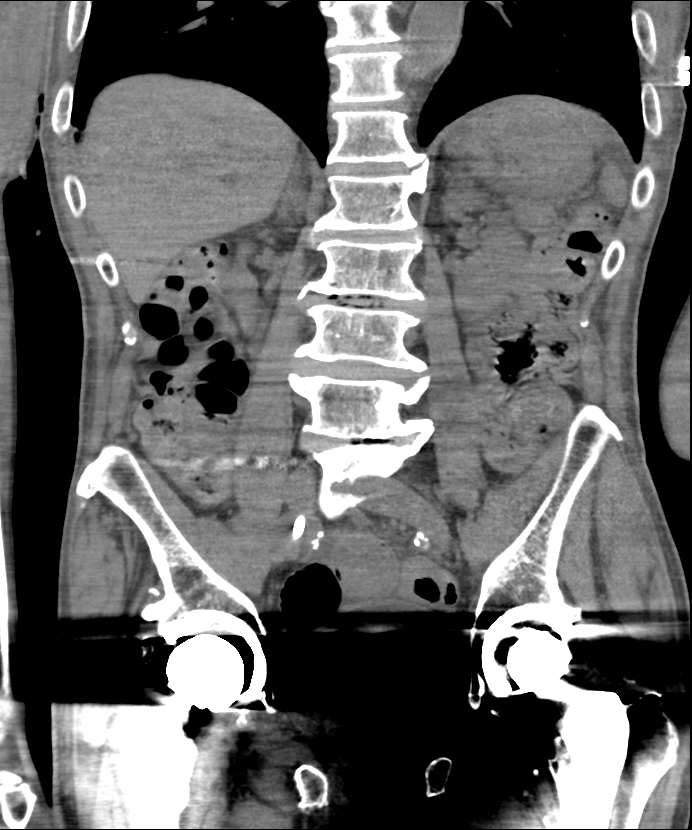

[14 of 46 positions shown; findings below may reference images not displayed]

FINDINGS: There is moderate dilatation of proximal and mid small bowel. There
is pneumatosis involving some of the dilated small bowel loops.
There may also be pneumatosis of the gastric wall. The findings are
concerning for ischemic bowel. No free peritoneal air is evident. No
portal venous air is evident.

There are unremarkable unenhanced appearances of the liver, spleen,
pancreas, adrenals and kidneys. No significant ascites collections.
Extensive atherosclerotic plaque is present throughout the aorta.
Patency of the mesenteric vessels cannot be established in the
absence of intravenous contrast.

There is a right lower quadrant hernia containing unobstructed
distal small bowel. Mild atelectatic appearing airspace opacity is
present in the right lung base.
IMPRESSION: Small bowel dilatation and pneumatosis, concerning for bowel
ischemia. These results were called by telephone at the time of
who verbally acknowledged these results.
